# Patient Record
Sex: Female | Born: 1950
Health system: Southern US, Community
[De-identification: ages and names within clinical notes are randomized; demographics above are authoritative.]

## PROBLEM LIST (undated history)

## (undated) DIAGNOSIS — J449 Chronic obstructive pulmonary disease, unspecified: Secondary | ICD-10-CM

## (undated) DIAGNOSIS — E1142 Type 2 diabetes mellitus with diabetic polyneuropathy: Secondary | ICD-10-CM

## (undated) DIAGNOSIS — J45909 Unspecified asthma, uncomplicated: Secondary | ICD-10-CM

## (undated) DIAGNOSIS — E119 Type 2 diabetes mellitus without complications: Secondary | ICD-10-CM

## (undated) DIAGNOSIS — G4700 Insomnia, unspecified: Secondary | ICD-10-CM

## (undated) DIAGNOSIS — I447 Left bundle-branch block, unspecified: Secondary | ICD-10-CM

## (undated) DIAGNOSIS — I10 Essential (primary) hypertension: Secondary | ICD-10-CM

## (undated) HISTORY — DX: Essential (primary) hypertension: I10

## (undated) HISTORY — DX: Unspecified asthma, uncomplicated: J45.909

## (undated) HISTORY — DX: Type 2 diabetes mellitus without complications: E11.9

## (undated) HISTORY — DX: Left bundle-branch block, unspecified: I44.7

## (undated) HISTORY — DX: Chronic obstructive pulmonary disease, unspecified: J44.9

## (undated) HISTORY — DX: Insomnia, unspecified: G47.00

## (undated) HISTORY — PX: NO PAST SURGERIES: SHX2092

## (undated) HISTORY — DX: Type 2 diabetes mellitus with diabetic polyneuropathy: E11.42

---

## 2014-12-13 ENCOUNTER — Other Ambulatory Visit: Payer: Self-pay | Admitting: Family Medicine

## 2014-12-13 DIAGNOSIS — N644 Mastodynia: Secondary | ICD-10-CM

## 2014-12-13 DIAGNOSIS — Z1231 Encounter for screening mammogram for malignant neoplasm of breast: Secondary | ICD-10-CM

## 2014-12-20 ENCOUNTER — Ambulatory Visit
Admission: RE | Admit: 2014-12-20 | Discharge: 2014-12-20 | Disposition: A | Discharge status: Home / Self Care | Payer: No Typology Code available for payment source | Source: Ambulatory Visit | Attending: Family Medicine | Admitting: Family Medicine

## 2014-12-20 DIAGNOSIS — Z1231 Encounter for screening mammogram for malignant neoplasm of breast: Secondary | ICD-10-CM

## 2014-12-27 ENCOUNTER — Ambulatory Visit: Payer: Self-pay

## 2016-07-17 ENCOUNTER — Telehealth: Payer: Self-pay | Admitting: *Deleted

## 2016-07-17 NOTE — Telephone Encounter (Signed)
NOTES SENT TO SCHEDULING.  °

## 2016-07-30 ENCOUNTER — Other Ambulatory Visit: Payer: Self-pay

## 2016-07-30 DIAGNOSIS — E119 Type 2 diabetes mellitus without complications: Secondary | ICD-10-CM | POA: Insufficient documentation

## 2016-07-30 DIAGNOSIS — J45909 Unspecified asthma, uncomplicated: Secondary | ICD-10-CM | POA: Insufficient documentation

## 2016-07-30 DIAGNOSIS — I447 Left bundle-branch block, unspecified: Secondary | ICD-10-CM | POA: Insufficient documentation

## 2016-07-30 DIAGNOSIS — E1142 Type 2 diabetes mellitus with diabetic polyneuropathy: Secondary | ICD-10-CM | POA: Insufficient documentation

## 2016-07-30 DIAGNOSIS — I10 Essential (primary) hypertension: Secondary | ICD-10-CM | POA: Insufficient documentation

## 2016-07-30 DIAGNOSIS — J449 Chronic obstructive pulmonary disease, unspecified: Secondary | ICD-10-CM | POA: Insufficient documentation

## 2016-07-30 NOTE — Progress Notes (Signed)
Cardiology Office Note NEW PATIENT VISIT   Date:  07/31/2016   ID:  Krystal Mckay, DOB 1950/07/27, MRN [id number]  PCP:  Kathyrn Lass, MD Dr. Kathyrn Lass Cardiologist:  NEW  Dr. Burt Knack  Chief Complaint  Patient presents with  . Chest Pain      History of Present Illness: Krystal Mckay is a 66 y.o. female from [place] with her daughter she is helping translate at times, who presents for LBBB and Lt sided chest pain, SOB and fatigue with hx HTN and DM.   She was on statin for cholesterol but she stopped.  Other hx includes Asthma, COPD, diabetic peripheral neuropathy, bil lower ext edema.   Today she has episodes of chest pain about once a week on Lt side. Also with SOB, and severe fatigue as if she could faint though she never has.  Chest pain more with washing dishes or lying on her Lt side.   She also has occ palpitations.   Rare nausea with these episodes,  She does have LBBB unknown if this is new or old.  BP is elevated today but was 126/60 with PCP.    Her parents died in their 3s and she does have one brother that died suddenly unknown reason.  Her risk factors HTN, DM, HLD and possible family hx.    Past Medical History:  Diagnosis Date  . Asthma   . COPD (chronic obstructive pulmonary disease) ([facility])   . Diabetic peripheral neuropathy ([facility])   . Hypertension   . Insomnia   . LBBB (left bundle branch block)   . Type 2 diabetes mellitus ([facility])     Past Surgical History:  Procedure Laterality Date  . NO PAST SURGERIES       Current Outpatient Prescriptions  Medication Sig Dispense Refill  . albuterol (PROVENTIL HFA;VENTOLIN HFA) 108 (90 Base) MCG/ACT inhaler Inhale 2 puffs into the lungs every 4 (four) hours as needed for wheezing or shortness of breath.    Marland Kitchen amLODipine (NORVASC) 10 MG tablet Take 10 mg by mouth daily.    Marland Kitchen atorvastatin (LIPITOR) 10 MG tablet Take 10 mg by mouth daily.    . Fluticasone-Salmeterol (ADVAIR) 500-50 MCG/DOSE AEPB Inhale 1  puff into the lungs 2 (two) times daily.    . metFORMIN (GLUCOPHAGE) 500 MG tablet Take 1,000 mg by mouth 2 (two) times daily with a meal.    . Olopatadine HCl (PATADAY) 0.2 % SOLN Apply to eye.     No current facility-administered medications for this visit.     Allergies:   Patient has no known allergies.    Social History:  The patient  reports that she has never smoked. She has never used smokeless tobacco. She reports that she does not drink alcohol or use drugs.   Family History:  The patient's family history includes Blindness in her father; Sudden death in her brother.    ROS:  General:recent sinusitis now treated, no weight changes Skin:no rashes or ulcers HEENT:no blurred vision, no congestion CV:see HPI PUL:see HPI GI:no diarrhea constipation or melena, no indigestion GU:no hematuria, no dysuria, freq urination MS:no joint pain, no claudication, + neuropathy Neuro:no syncope, + lightheadedness at times, + neuropathy of feet from diabetes. Endo:+ diabetes, no thyroid disease  Wt Readings from Last 3 Encounters:  07/31/16 189 lb 12.8 oz (86.1 kg)     PHYSICAL EXAM: VS:  BP (!) 150/78   Pulse 72   Ht 5' 6" (1.676 m)   Wt 189 lb  12.8 oz (86.1 kg)   LMP  (LMP Unknown)   BMI 30.63 kg/m  , BMI Body mass index is 30.63 kg/m. General:Pleasant affect, NAD Skin:Warm and dry, brisk capillary refill HEENT:normocephalic, sclera clear, mucus membranes moist Neck:supple, no JVD, no bruits  Heart:S1S2 RRR without murmur, gallup, rub or click, + lt sided tenderness to palpation of Lt chest wall. Lungs:clear without rales, rhonchi, or wheezes XKG:YJEH, non tender, + BS, do not palpate liver spleen or masses Ext:no lower ext edema, 2+ pedal pulses, 2+ radial pulses Neuro:alert and oriented, MAE, follows commands, + facial symmetry    EKG:  EKG is ordered today. The ekg ordered today demonstrates SR with LBBB, unknown age.   Recent Labs: No results found for requested  labs within last 8760 hours.    Lipid Panel No results found for: CHOL, TRIG, HDL, CHOLHDL, VLDL, LDLCALC, LDLDIRECT     Other studies Reviewed: Additional studies/ records that were reviewed today include: previous office notes and EKG.   ASSESSMENT AND PLAN:  1.  Chest pain - with LBBB and hx HTN, DM and elevated LDL.  Will proceed with lexiscan myoview to evaluate - this seems muscular skeletal and she can take ibuprofen for the pain for now.  I also asked her to take 81 mg ASA until test are back.    2. SOB with lexiscan myoview and echo to eval LV function.    3. HTN elevated today but was controlled at PCP, will check with stress test.  4. HLD will recheck today off statin.  5.  DM-2 per PCP, she does have neuropathy as well.   Dr. Burt Knack met her and discussed tests.  I will follow up with results.      Current medicines are reviewed with the patient today.  The patient Has no concerns regarding medicines.  The following changes have been made:  See above Labs/ tests ordered today include:see above  Disposition:   FU:  see above  Signed, Cecilie Kicks, NP  07/31/2016 10:01 AM    [facility], [address] [place], [place], [place] Phone: [phone]; Fax: [phone]

## 2016-07-31 ENCOUNTER — Ambulatory Visit (INDEPENDENT_AMBULATORY_CARE_PROVIDER_SITE_OTHER): Payer: No Typology Code available for payment source | Admitting: Cardiology

## 2016-07-31 ENCOUNTER — Encounter: Payer: Self-pay | Admitting: Cardiology

## 2016-07-31 ENCOUNTER — Encounter (INDEPENDENT_AMBULATORY_CARE_PROVIDER_SITE_OTHER): Payer: Self-pay

## 2016-07-31 VITALS — BP 150/78 | HR 72 | Ht 66.0 in | Wt 189.8 lb

## 2016-07-31 DIAGNOSIS — E118 Type 2 diabetes mellitus with unspecified complications: Secondary | ICD-10-CM

## 2016-07-31 DIAGNOSIS — I10 Essential (primary) hypertension: Secondary | ICD-10-CM

## 2016-07-31 DIAGNOSIS — R0602 Shortness of breath: Secondary | ICD-10-CM

## 2016-07-31 DIAGNOSIS — E782 Mixed hyperlipidemia: Secondary | ICD-10-CM

## 2016-07-31 DIAGNOSIS — R079 Chest pain, unspecified: Secondary | ICD-10-CM

## 2016-07-31 LAB — LIPID PANEL
CHOLESTEROL TOTAL: 166 mg/dL (ref 100–199)
Chol/HDL Ratio: 2.8 ratio (ref 0.0–4.4)
HDL: 60 mg/dL (ref >39)
LDL Calculated: 96 mg/dL (ref 0–99)
Triglycerides: 48 mg/dL (ref 0–149)
VLDL CHOLESTEROL CAL: 10 mg/dL (ref 5–40)

## 2016-07-31 LAB — COMPREHENSIVE METABOLIC PANEL
A/G RATIO: 1.5 (ref 1.2–2.2)
ALBUMIN: 4.3 g/dL (ref 3.6–4.8)
ALT: 18 IU/L (ref 0–32)
AST: 15 IU/L (ref 0–40)
Alkaline Phosphatase: 66 IU/L (ref 39–117)
BUN / CREAT RATIO: 16 (ref 12–28)
BUN: 13 mg/dL (ref 8–27)
CALCIUM: 9.5 mg/dL (ref 8.7–10.3)
CHLORIDE: 103 mmol/L (ref 96–106)
CO2: 24 mmol/L (ref 18–29)
Creatinine, Ser: 0.81 mg/dL (ref 0.57–1.00)
GFR, EST AFRICAN AMERICAN: 89 mL/min/{1.73_m2} (ref >59)
GFR, EST NON AFRICAN AMERICAN: 77 mL/min/{1.73_m2} (ref >59)
GLOBULIN, TOTAL: 2.8 g/dL (ref 1.5–4.5)
Glucose: 94 mg/dL (ref 65–99)
POTASSIUM: 4.7 mmol/L (ref 3.5–5.2)
SODIUM: 143 mmol/L (ref 134–144)
TOTAL PROTEIN: 7.1 g/dL (ref 6.0–8.5)

## 2016-07-31 LAB — TSH: TSH: 2.04 u[IU]/mL (ref 0.450–4.500)

## 2016-07-31 LAB — T4, FREE: Free T4: 1.22 ng/dL (ref 0.82–1.77)

## 2016-07-31 NOTE — Addendum Note (Signed)
Addended by: Tonita PhoenixBOWDEN, ROBIN K on: 07/31/2016 10:18 AM   Modules accepted: Orders

## 2016-07-31 NOTE — Addendum Note (Signed)
Addended by: Brien MatesGONZALEZ, DANIELLE R on: 07/31/2016 10:18 AM   Modules accepted: Orders

## 2016-07-31 NOTE — Patient Instructions (Signed)
Medication Instructions:  Your physician has recommended you make the following change in your medication: 1.)START Aspirin 81 mg daily.   Labwork: Your physician recommends that you return for lab work today for LIPID, CMET, TSH, FREE T4  Testing/Procedures: Your physician has requested that you have an echocardiogram. Echocardiography is a painless test that uses sound waves to create images of your heart. It provides your doctor with information about the size and shape of your heart and how well your heart's chambers and valves are working. This procedure takes approximately one hour. There are no restrictions for this procedure.  Your physician has requested that you have a lexiscan myoview. For further information please visit [email or web address]. Please follow instruction sheet, as given.    Follow-Up: Your physician recommends that you schedule a follow-up appointment with Nada BoozerLaura Ingold, FNP after tests are completed.   Any Other Special Instructions Will Be Listed Below (If Applicable).     If you need a refill on your cardiac medications before your next appointment, please call your pharmacy.  Thank you for choosing [facility]  Corrine CMA AAMA

## 2016-08-12 ENCOUNTER — Telehealth (HOSPITAL_COMMUNITY): Payer: Self-pay | Admitting: *Deleted

## 2016-08-12 NOTE — Telephone Encounter (Signed)
Patient's daughter given detailed instructions per DPR per Myocardial Perfusion Study Information Sheet for the test on 08/14/16 at 1000. Patient notified to arrive 15 minutes early and that it is imperative to arrive on time for appointment to keep from having the test rescheduled.  If you need to cancel or reschedule your appointment, please call the office within 24 hours of your appointment. . Patient verbalized understanding.Caylor Cerino, Adelene IdlerCynthia W

## 2016-08-14 ENCOUNTER — Other Ambulatory Visit: Payer: Self-pay

## 2016-08-14 ENCOUNTER — Ambulatory Visit (HOSPITAL_COMMUNITY): Payer: No Typology Code available for payment source | Attending: Cardiology

## 2016-08-14 ENCOUNTER — Ambulatory Visit (HOSPITAL_BASED_OUTPATIENT_CLINIC_OR_DEPARTMENT_OTHER): Payer: No Typology Code available for payment source

## 2016-08-14 DIAGNOSIS — R079 Chest pain, unspecified: Secondary | ICD-10-CM

## 2016-08-14 DIAGNOSIS — I447 Left bundle-branch block, unspecified: Secondary | ICD-10-CM | POA: Insufficient documentation

## 2016-08-14 DIAGNOSIS — R0602 Shortness of breath: Secondary | ICD-10-CM

## 2016-08-14 LAB — MYOCARDIAL PERFUSION IMAGING
CHL CUP NUCLEAR SRS: 9
CHL CUP NUCLEAR SSS: 15
LV dias vol: 125 mL (ref 46–106)
LV sys vol: 66 mL
NUC STRESS TID: 1.06
Peak HR: 83 {beats}/min
RATE: 0.24
Rest HR: 64 {beats}/min
SDS: 6

## 2016-08-14 MED ORDER — REGADENOSON 0.4 MG/5ML IV SOLN
0.4000 mg | Freq: Once | INTRAVENOUS | Status: AC
  Administered 2016-08-14: 0.4 mg via INTRAVENOUS

## 2016-08-14 MED ORDER — TECHNETIUM TC 99M TETROFOSMIN IV KIT
10.3000 | PACK | Freq: Once | INTRAVENOUS | Status: AC | PRN
  Administered 2016-08-14: 10.3 via INTRAVENOUS
  Filled 2016-08-14: qty 11

## 2016-08-14 MED ORDER — TECHNETIUM TC 99M TETROFOSMIN IV KIT
30.6000 | PACK | Freq: Once | INTRAVENOUS | Status: AC | PRN
  Administered 2016-08-14: 30.6 via INTRAVENOUS
  Filled 2016-08-14: qty 31

## 2016-08-18 NOTE — Progress Notes (Signed)
Cardiology Office Note   Date:  08/19/2016   ID:  Bethanee, Redondo October 22, 1950, MRN [id number]  PCP:  Sigmund Hazel, MD  Cardiologist:  Dr. Excell Seltzer    Chief Complaint  Patient presents with  . Chest Pain      History of Present Illness: Krystal Mckay is a 66 y.o. female who presents for follow up after stress test.  Also hx of Asthma, COPD, diabetic peripheral neuropathy, bil lower ext edema.  She does have LBBB unknown if this is new or old  Labs are normal. Echo: Left ventricle: The cavity size was normal. There was moderate   concentric hypertrophy. Systolic function was normal. The   estimated ejection fraction was in the range of 55% to 60%. Wall   motion was normal; there were no regional wall motion   abnormalities. Features are consistent with a pseudonormal left   ventricular filling pattern, with concomitant abnormal relaxation   and increased filling pressure (grade 2 diastolic dysfunction).   Doppler parameters are consistent with high ventricular filling   pressure. - Ventricular septum: Septal motion showed paradox. These changes   are consistent with a left bundle branch block. - Aortic valve: Trileaflet; mildly thickened, mildly calcified   leaflets. - Mitral valve: Calcified annulus. There was mild regurgitation.  Nuc study:  Nuclear stress EF: 47%.  There was no ST segment deviation noted during stress.  The left ventricular ejection fraction is mildly decreased (45-54%).  This is an intermediate risk study.   1. EF 47% with septal hypokinesis.  2. Partially reversible, medium-sized, intermediate intensity basal to mid anterior/anteroseptal/inferoseptal perfusion defect.  This could be artifact due to LBBB.  However, cannot fully rule out infarction with peri-infarct ischemia given wall motion abnormality but unusual wall segment involvement for coronary disease.    Overall, intermediate risk study  Today she denies chest pain or SOB  today.  She is feeling well.  I discussed her studies with Dr. Excell Seltzer and most likely nuc abnormality is related to LBBB.  But will proceed with Cardiac CTA with FFR to eval for disease and flow.  I discussed with pt and her daughter by phone.  Pt is to go to [place] in August.  Both agreeable to proceed.  Lipids stable and will add statin if CT +.      Past Medical History:  Diagnosis Date  . Asthma   . COPD (chronic obstructive pulmonary disease) ([facility])   . Diabetic peripheral neuropathy ([facility])   . Hypertension   . Insomnia   . LBBB (left bundle branch block)   . Type 2 diabetes mellitus ([facility])     Past Surgical History:  Procedure Laterality Date  . NO PAST SURGERIES       Current Outpatient Prescriptions  Medication Sig Dispense Refill  . albuterol (PROVENTIL HFA;VENTOLIN HFA) 108 (90 Base) MCG/ACT inhaler Inhale 2 puffs into the lungs every 4 (four) hours as needed for wheezing or shortness of breath.    Marland Kitchen amLODipine (NORVASC) 10 MG tablet Take 10 mg by mouth daily.    Marland Kitchen aspirin EC 81 MG tablet Take 81 mg by mouth daily.    . Fluticasone-Salmeterol (ADVAIR) 500-50 MCG/DOSE AEPB Inhale 1 puff into the lungs 2 (two) times daily.    . metFORMIN (GLUCOPHAGE) 500 MG tablet Take 1,000 mg by mouth 2 (two) times daily with a meal.    . Olopatadine HCl (PATADAY) 0.2 % SOLN Apply to eye.     No current facility-administered medications  for this visit.     Allergies:   Patient has no known allergies.    Social History:  The patient  reports that she has never smoked. She has never used smokeless tobacco. She reports that she does not drink alcohol or use drugs.   Family History:  The patient's family history includes Blindness in her father; Sudden death in her brother.    ROS:  General:no colds or fevers, no weight changes Skin:no rashes or ulcers HEENT:no blurred vision, no congestion CV:see HPI PUL:see HPI GI:no diarrhea constipation or melena, no indigestion GU:no hematuria,  no dysuria MS:no joint pain, no claudication Neuro:no syncope, no lightheadedness Endo:no diabetes, no thyroid disease  Wt Readings from Last 3 Encounters:  08/19/16 191 lb 6.4 oz (86.8 kg)  08/14/16 189 lb (85.7 kg)  07/31/16 189 lb 12.8 oz (86.1 kg)     PHYSICAL EXAM: VS:  BP 124/60   Pulse 70   Ht 5\' 6"  (1.676 m)   Wt 191 lb 6.4 oz (86.8 kg)   LMP  (LMP Unknown)   SpO2 98%   BMI 30.89 kg/m  , BMI Body mass index is 30.89 kg/m. General:Pleasant affect, NAD Skin:Warm and dry, brisk capillary refill HEENT:normocephalic, sclera clear, mucus membranes moist Neck:supple, no JVD, no bruits  Heart:S1S2 RRR without murmur, gallup, rub or click Lungs:clear without rales, rhonchi, or wheezes JXB:JYNWAbd:soft, non tender, + BS, do not palpate liver spleen or masses Ext:no lower ext edema, 2+ pedal pulses, 2+ radial pulses Neuro:alert and oriented, MAE, follows commands, + facial symmetry    EKG:  EKG is NOT ordered today.    Recent Labs: 07/31/2016: ALT 18; BUN 13; Creatinine, Ser 0.81; Potassium 4.7; Sodium 143; TSH 2.040    Lipid Panel    Component Value Date/Time   CHOL 166 07/31/2016 1018   TRIG 48 07/31/2016 1018   HDL 60 07/31/2016 1018   CHOLHDL 2.8 07/31/2016 1018   LDLCALC 96 07/31/2016 1018       Other studies Reviewed: Additional studies/ records that were reviewed today include: see above.   ASSESSMENT AND PLAN:  1.  Chest pain with LBBB hx HTN, DM elevated LDL at times.  Now on ASA.  intermediate nuc.  Will plan for Cardiac CTA and FFR will follow up if + but if neg. Follow up prn.  2. SOB has resolved  3. HTN controlled  4. DM-2 followed by PCP.    Current medicines are reviewed with the patient today.  The patient Has no concerns regarding medicines.  The following changes have been made:  See above Labs/ tests ordered today include:see above  Disposition:   FU:  see above  Signed, Nada BoozerLaura Dejha King, NP  08/19/2016 9:42 PM    [facility] [address], [place], [place]  [address]/ [address] [place], [place] Phone: [phone]; Fax: [phone]  [phone]

## 2016-08-19 ENCOUNTER — Encounter (INDEPENDENT_AMBULATORY_CARE_PROVIDER_SITE_OTHER): Payer: Self-pay

## 2016-08-19 ENCOUNTER — Encounter: Payer: Self-pay | Admitting: Cardiology

## 2016-08-19 ENCOUNTER — Ambulatory Visit (INDEPENDENT_AMBULATORY_CARE_PROVIDER_SITE_OTHER): Payer: No Typology Code available for payment source | Admitting: Cardiology

## 2016-08-19 VITALS — BP 124/60 | HR 70 | Ht 66.0 in | Wt 191.4 lb

## 2016-08-19 DIAGNOSIS — R0602 Shortness of breath: Secondary | ICD-10-CM

## 2016-08-19 DIAGNOSIS — E118 Type 2 diabetes mellitus with unspecified complications: Secondary | ICD-10-CM

## 2016-08-19 DIAGNOSIS — R9439 Abnormal result of other cardiovascular function study: Secondary | ICD-10-CM

## 2016-08-19 DIAGNOSIS — R079 Chest pain, unspecified: Secondary | ICD-10-CM

## 2016-08-19 DIAGNOSIS — I10 Essential (primary) hypertension: Secondary | ICD-10-CM

## 2016-08-19 NOTE — Patient Instructions (Signed)
Medication Instructions:  None  Labwork: None  Testing/Procedures: Your physician has requested that you have cardiac CT. Cardiac computed tomography (CT) is a painless test that uses an x-ray machine to take clear, detailed pictures of your heart. For further information please visit [email or web address]. Please follow instruction sheet as given.   Follow-Up: Your physician recommends that you schedule a follow-up appointment will be based on outcome of CT.   Any Other Special Instructions Will Be Listed Below (If Applicable).     If you need a refill on your cardiac medications before your next appointment, please call your pharmacy.

## 2016-09-24 ENCOUNTER — Encounter: Payer: Self-pay | Admitting: Cardiology

## 2016-10-08 ENCOUNTER — Ambulatory Visit (HOSPITAL_COMMUNITY)
Admission: RE | Admit: 2016-10-08 | Discharge: 2016-10-08 | Disposition: A | Discharge status: Home / Self Care | Payer: Self-pay | Source: Ambulatory Visit | Attending: Cardiology | Admitting: Cardiology

## 2016-10-08 ENCOUNTER — Ambulatory Visit (HOSPITAL_COMMUNITY)
Admission: RE | Admit: 2016-10-08 | Discharge: 2016-10-08 | Disposition: A | Discharge status: Home / Self Care | Payer: No Typology Code available for payment source | Source: Ambulatory Visit | Attending: Cardiology | Admitting: Cardiology

## 2016-10-08 DIAGNOSIS — R9439 Abnormal result of other cardiovascular function study: Secondary | ICD-10-CM

## 2016-10-08 LAB — POCT I-STAT CREATININE: CREATININE: 0.8 mg/dL (ref 0.44–1.00)

## 2016-10-08 MED ORDER — NITROGLYCERIN 0.4 MG SL SUBL
0.8000 mg | SUBLINGUAL_TABLET | SUBLINGUAL | Status: DC
  Administered 2016-10-08: 0.8 mg via SUBLINGUAL
  Filled 2016-10-08: qty 25

## 2016-10-08 MED ORDER — METOPROLOL TARTRATE 5 MG/5ML IV SOLN
INTRAVENOUS | Status: AC
  Filled 2016-10-08: qty 5

## 2016-10-08 MED ORDER — METOPROLOL TARTRATE 5 MG/5ML IV SOLN
5.0000 mg | INTRAVENOUS | Status: DC
  Administered 2016-10-08: 5 mg via INTRAVENOUS
  Filled 2016-10-08: qty 5

## 2016-10-08 MED ORDER — IOPAMIDOL (ISOVUE-370) INJECTION 76%
INTRAVENOUS | Status: AC
  Administered 2016-10-08: 100 mL
  Filled 2016-10-08: qty 100

## 2016-10-08 MED ORDER — NITROGLYCERIN 0.4 MG SL SUBL
SUBLINGUAL_TABLET | SUBLINGUAL | Status: AC
  Filled 2016-10-08: qty 2

## 2017-08-25 ENCOUNTER — Other Ambulatory Visit: Payer: Self-pay

## 2017-08-25 ENCOUNTER — Emergency Department (HOSPITAL_BASED_OUTPATIENT_CLINIC_OR_DEPARTMENT_OTHER): Payer: Medicare Other

## 2017-08-25 ENCOUNTER — Emergency Department (HOSPITAL_BASED_OUTPATIENT_CLINIC_OR_DEPARTMENT_OTHER)
Admission: EM | Admit: 2017-08-25 | Discharge: 2017-08-25 | Disposition: A | Discharge status: Home / Self Care | Payer: Medicare Other | Attending: Physician Assistant | Admitting: Physician Assistant

## 2017-08-25 ENCOUNTER — Encounter (HOSPITAL_BASED_OUTPATIENT_CLINIC_OR_DEPARTMENT_OTHER): Payer: Self-pay | Admitting: *Deleted

## 2017-08-25 DIAGNOSIS — E1140 Type 2 diabetes mellitus with diabetic neuropathy, unspecified: Secondary | ICD-10-CM | POA: Diagnosis not present

## 2017-08-25 DIAGNOSIS — I10 Essential (primary) hypertension: Secondary | ICD-10-CM | POA: Insufficient documentation

## 2017-08-25 DIAGNOSIS — B349 Viral infection, unspecified: Secondary | ICD-10-CM | POA: Insufficient documentation

## 2017-08-25 DIAGNOSIS — R05 Cough: Secondary | ICD-10-CM | POA: Diagnosis present

## 2017-08-25 DIAGNOSIS — Z7984 Long term (current) use of oral hypoglycemic drugs: Secondary | ICD-10-CM | POA: Insufficient documentation

## 2017-08-25 DIAGNOSIS — Z7982 Long term (current) use of aspirin: Secondary | ICD-10-CM | POA: Insufficient documentation

## 2017-08-25 DIAGNOSIS — J449 Chronic obstructive pulmonary disease, unspecified: Secondary | ICD-10-CM | POA: Insufficient documentation

## 2017-08-25 DIAGNOSIS — Z79899 Other long term (current) drug therapy: Secondary | ICD-10-CM | POA: Diagnosis not present

## 2017-08-25 LAB — HEPATIC FUNCTION PANEL
ALK PHOS: 77 U/L (ref 38–126)
ALT: 14 U/L (ref 14–54)
AST: 19 U/L (ref 15–41)
Albumin: 3.7 g/dL (ref 3.5–5.0)
Bilirubin, Direct: <0.1 mg/dL — ABNORMAL LOW (ref 0.1–0.5)
TOTAL PROTEIN: 7.5 g/dL (ref 6.5–8.1)
Total Bilirubin: 0.4 mg/dL (ref 0.3–1.2)

## 2017-08-25 LAB — BASIC METABOLIC PANEL
ANION GAP: 8 (ref 5–15)
BUN: 12 mg/dL (ref 6–20)
CO2: 27 mmol/L (ref 22–32)
Calcium: 9 mg/dL (ref 8.9–10.3)
Chloride: 104 mmol/L (ref 101–111)
Creatinine, Ser: 0.72 mg/dL (ref 0.44–1.00)
GFR calc Af Amer: >60 mL/min (ref >60)
GFR calc non Af Amer: >60 mL/min (ref >60)
GLUCOSE: 141 mg/dL — AB (ref 65–99)
POTASSIUM: 3.4 mmol/L — AB (ref 3.5–5.1)
Sodium: 139 mmol/L (ref 135–145)

## 2017-08-25 LAB — CBC
HCT: 39.5 % (ref 36.0–46.0)
HEMOGLOBIN: 13.3 g/dL (ref 12.0–15.0)
MCH: 27.3 pg (ref 26.0–34.0)
MCHC: 33.7 g/dL (ref 30.0–36.0)
MCV: 81.1 fL (ref 78.0–100.0)
Platelets: 246 10*3/uL (ref 150–400)
RBC: 4.87 MIL/uL (ref 3.87–5.11)
RDW: 14.5 % (ref 11.5–15.5)
WBC: 4.1 10*3/uL (ref 4.0–10.5)

## 2017-08-25 LAB — TROPONIN I: Troponin I: <0.03 ng/mL (ref <0.03)

## 2017-08-25 LAB — URINALYSIS, ROUTINE W REFLEX MICROSCOPIC
Bilirubin Urine: NEGATIVE
GLUCOSE, UA: NEGATIVE mg/dL
Hgb urine dipstick: NEGATIVE
KETONES UR: NEGATIVE mg/dL
LEUKOCYTES UA: NEGATIVE
Nitrite: NEGATIVE
PH: 6.5 (ref 5.0–8.0)
Protein, ur: NEGATIVE mg/dL
Specific Gravity, Urine: <1.005 — ABNORMAL LOW (ref 1.005–1.030)

## 2017-08-25 LAB — LIPASE, BLOOD: Lipase: 36 U/L (ref 11–51)

## 2017-08-25 MED ORDER — SODIUM CHLORIDE 0.9 % IV BOLUS
1000.0000 mL | Freq: Once | INTRAVENOUS | Status: AC
Start: 2017-08-25 — End: 2017-08-25
  Administered 2017-08-25: 1000 mL via INTRAVENOUS

## 2017-08-25 MED ORDER — ACETAMINOPHEN 500 MG PO TABS: 1000.0000 mg | ORAL_TABLET | Freq: Three times a day (TID) | ORAL | 0 refills | Status: AC | PRN

## 2017-08-25 MED ORDER — ACETAMINOPHEN 500 MG PO TABS
1000.0000 mg | ORAL_TABLET | Freq: Three times a day (TID) | ORAL | Status: DC | PRN
  Administered 2017-08-25: 1000 mg via ORAL
  Filled 2017-08-25: qty 2

## 2017-08-25 MED ORDER — POTASSIUM CHLORIDE 10 MEQ/100ML IV SOLN
10.0000 meq | Freq: Once | INTRAVENOUS | Status: AC
  Administered 2017-08-25: 10 meq via INTRAVENOUS
  Filled 2017-08-25: qty 100

## 2017-08-25 NOTE — ED Provider Notes (Signed)
[facility] EMERGENCY DEPARTMENT Provider Note   CSN: [id number] Arrival date & time: 08/25/17  1234     History   Chief Complaint Chief Complaint  Patient presents with  . Chest Pain    HPI Krystal Mckay is a 67 y.o. female.  HPI   Patient is a 67 year old female from [place] presenting today with "sickness all over".  Patient is very sweet, however is not a very good historian.  Patient reports that she "has the sickness in her ".  She reports that she has feelings of sickness all over.  Occasional cough, congestion, occasional shortness of breath.  She says however she went to physician for this.  Looking at her records she went to a cardiologist on the 11th where she had an stress test showing EF of 47%.  She denies any exertional chest pain currently.  She does have cough and congestion and reports that she feels fatigued.  She denies any fever, vomiting, diarrhea..  She reports that the symptoms currently been going on for about 3 days.  She reports that the most concerning symptoms are that she feels restless, and is having trouble sleeping.  Past Medical History:  Diagnosis Date  . Asthma   . COPD (chronic obstructive pulmonary disease) ([facility])   . Diabetic peripheral neuropathy ([facility])   . Hypertension   . Insomnia   . LBBB (left bundle branch block)   . Type 2 diabetes mellitus [facility])     Patient Active Problem List   Diagnosis Date Noted  . Type 2 diabetes mellitus ([facility])   . Diabetic peripheral neuropathy ([facility])   . Hypertension   . Asthma   . COPD (chronic obstructive pulmonary disease) ([facility])   . LBBB (left bundle branch block)     Past Surgical History:  Procedure Laterality Date  . NO PAST SURGERIES       OB History   None      Home Medications    Prior to Admission medications   Medication Sig Start Date End Date Taking? Authorizing Provider  acetaminophen (TYLENOL) 500 MG tablet Take 2 tablets (1,000 mg total) by mouth every 8  (eight) hours as needed for moderate pain. 08/25/17   Morrie Daywalt Lyn, MD  albuterol (PROVENTIL HFA;VENTOLIN HFA) 108 (90 Base) MCG/ACT inhaler Inhale 2 puffs into the lungs every 4 (four) hours as needed for wheezing or shortness of breath.    [provider]  amLODipine (NORVASC) 10 MG tablet Take 10 mg by mouth daily.    [provider]  aspirin EC 81 MG tablet Take 81 mg by mouth daily.    [provider]  Fluticasone-Salmeterol (ADVAIR) 500-50 MCG/DOSE AEPB Inhale 1 puff into the lungs 2 (two) times daily.    [provider]  metFORMIN (GLUCOPHAGE) 500 MG tablet Take 1,000 mg by mouth 2 (two) times daily with a meal.    [provider]  Olopatadine HCl (PATADAY) 0.2 % SOLN Apply to eye.    [provider]    Family History Family History  Problem Relation Age of Onset  . Blindness Father   . Sudden death Brother     Social History Social History   Tobacco Use  . Smoking status: Never Smoker  . Smokeless tobacco: Never Used  Substance Use Topics  . Alcohol use: No  . Drug use: No     Allergies   Patient has no known allergies.   Review of Systems Review of Systems  Constitutional:  Positive for activity change and fatigue.  Respiratory: Positive for cough. Negative for shortness of breath.   Cardiovascular: Negative for chest pain.  Gastrointestinal: Negative for abdominal pain, nausea and vomiting.  Musculoskeletal: Positive for arthralgias.  Neurological: Positive for weakness and headaches.  Psychiatric/Behavioral: Positive for sleep disturbance.  All other systems reviewed and are negative.    Physical Exam Updated Vital Signs BP 128/65   Pulse (!) 58   Resp 15   Ht 5\' 4"  (1.626 m)   Wt 86.6 kg (191 lb)   LMP  (LMP Unknown)   SpO2 99%   BMI 32.79 kg/m   Physical Exam  Constitutional: She is oriented to person, place, and time. She appears well-developed and well-nourished.  HENT:  Head:  Normocephalic and atraumatic.  Eyes: Right eye exhibits no discharge.  Cardiovascular: Normal rate, intact distal pulses and normal pulses.  Pulmonary/Chest: Effort normal. No accessory muscle usage. No respiratory distress.  Abdominal: Soft. Bowel sounds are normal. There is no tenderness.  Neurological: She is oriented to person, place, and time.  Skin: Skin is warm and dry. She is not diaphoretic.  Psychiatric: She has a normal mood and affect.  Nursing note and vitals reviewed.    ED Treatments / Results  Labs (all labs ordered are listed, but only abnormal results are displayed) Labs Reviewed  BASIC METABOLIC PANEL - Abnormal; Notable for the following components:      Result Value   Potassium 3.4 (*)    Glucose, Bld 141 (*)    All other components within normal limits  HEPATIC FUNCTION PANEL - Abnormal; Notable for the following components:   Bilirubin, Direct <0.1 (*)    All other components within normal limits  URINALYSIS, ROUTINE W REFLEX MICROSCOPIC - Abnormal; Notable for the following components:   Specific Gravity, Urine <1.005 (*)    All other components within normal limits  CBC  TROPONIN I  LIPASE, BLOOD    EKG EKG Interpretation  Date/Time:  Monday August 25 2017 12:42:40 EDT Ventricular Rate:  74 PR Interval:  146 QRS Duration: 152 QT Interval:  430 QTC Calculation: 477 R Axis:   61 Text Interpretation:  Normal sinus rhythm Left bundle branch block Abnormal ECG No old tracing to compare Confirmed by Rolan Bucco [phone]) on 08/25/2017 12:52:23 PM   Radiology Dg Chest 2 View  Result Date: 08/25/2017 CLINICAL DATA:  One week of chest pain, syncopal episodes, shortness of breath, and inability is sleepy. History of asthma-COPD, diabetes, never smoked. EXAM: CHEST - 2 VIEW COMPARISON:  Chest x-ray of March 04, 2016. FINDINGS: The lungs are adequately inflated. There is no focal infiltrate. There is no pleural effusion. The heart and pulmonary  vascularity are normal. The mediastinum is normal in width. The trachea is midline. The bony thorax exhibits no acute abnormality. IMPRESSION: There is no active cardiopulmonary disease. Electronically Signed   By: David  [place] M.D.   On: 08/25/2017 13:03    Procedures Procedures (including critical care time)  Medications Ordered in ED Medications  sodium chloride 0.9 % bolus 1,000 mL (0 mLs Intravenous Stopped 08/25/17 1818)  potassium chloride 10 mEq in 100 mL IVPB (0 mEq Intravenous Stopped 08/25/17 1814)     Initial Impression / Assessment and Plan / ED Course  I have reviewed the triage vital signs and the nursing notes.  Pertinent labs & imaging results that were available during my care of the patient were reviewed by me and considered in my medical decision making (  see chart for details).     Patient is a 67 year old female from [place] presenting today with "sickness all over".  Patient is very sweet, however is not a very good historian.  Patient reports that she "has the sickness in her ".  She reports that she has feelings of sickness all over.  Occasional cough, congestion, occasional shortness of breath.  She says however she went to physician for this.  Looking at her records she went to a cardiologist on the 11th where she had an stress test showing EF of 47%.  She denies any exertional chest pain currently.  She does have cough and congestion and reports that she feels fatigued.  She denies any fever, vomiting, diarrhea..  She reports that the symptoms currently been going on for about 3 days.  She reports that the most concerning symptoms are that she feels restless, and is having trouble sleeping.   4:45 PM We will do baseline labs, troponin, EKG, chest x-ray, UA.  Respiratory cancers infection.  Ultimately think she had likely has a virus that is making her feel ill all over.  Will give fluids, touch of potassium given the low potassium.  Do not suspect cardiac disease  given the patient does not have chest pain, nor any exertional component to her feelings of feeling "ill".    Patient's labs, imaging, EKG all reassuring.  We will encourage patient to follow-up with primary care physician.  Think is likely viral syndrome causing patient's feeling of being ill. Final Clinical Impressions(s) / ED Diagnoses   Final diagnoses:  Viral syndrome    ED Discharge Orders        Ordered    acetaminophen (TYLENOL) 500 MG tablet  Every 8 hours PRN     08/25/17 1822       Abelino Derrick, MD 08/25/17 2322

## 2017-08-25 NOTE — ED Notes (Signed)
2 unsuccessful IV attempts.

## 2017-08-25 NOTE — Discharge Instructions (Signed)
We could not find any cause for your discomfort today.  We are glad to report that your labs all look very good.  Please follow-up with your primary care physician.  Especially if you have any chest pain, return immediately to the cardiologist as we discussed.

## 2017-08-25 NOTE — ED Triage Notes (Signed)
Chest pain for a week. Her MD gave her extensive testing and could not find any abnormalities. States she continues to have severe pain, fainting, sob and unable to sleep. She is in no distress at triage.

## 2018-02-16 ENCOUNTER — Emergency Department: Payer: Medicare Other

## 2018-02-16 ENCOUNTER — Emergency Department
Admission: EM | Admit: 2018-02-16 | Discharge: 2018-02-16 | Disposition: A | Discharge status: Home / Self Care | Payer: Medicare Other | Attending: Emergency Medicine | Admitting: Emergency Medicine

## 2018-02-16 ENCOUNTER — Encounter: Payer: Self-pay | Admitting: Emergency Medicine

## 2018-02-16 ENCOUNTER — Other Ambulatory Visit: Payer: Self-pay

## 2018-02-16 DIAGNOSIS — R079 Chest pain, unspecified: Secondary | ICD-10-CM | POA: Diagnosis not present

## 2018-02-16 DIAGNOSIS — E1142 Type 2 diabetes mellitus with diabetic polyneuropathy: Secondary | ICD-10-CM | POA: Insufficient documentation

## 2018-02-16 DIAGNOSIS — Z79899 Other long term (current) drug therapy: Secondary | ICD-10-CM | POA: Diagnosis not present

## 2018-02-16 DIAGNOSIS — J449 Chronic obstructive pulmonary disease, unspecified: Secondary | ICD-10-CM | POA: Insufficient documentation

## 2018-02-16 DIAGNOSIS — I10 Essential (primary) hypertension: Secondary | ICD-10-CM | POA: Insufficient documentation

## 2018-02-16 LAB — CBC
HCT: 41.3 % (ref 36.0–46.0)
HEMOGLOBIN: 13 g/dL (ref 12.0–15.0)
MCH: 26.7 pg (ref 26.0–34.0)
MCHC: 31.5 g/dL (ref 30.0–36.0)
MCV: 85 fL (ref 80.0–100.0)
Platelets: 207 10*3/uL (ref 150–400)
RBC: 4.86 MIL/uL (ref 3.87–5.11)
RDW: 14.5 % (ref 11.5–15.5)
WBC: 3.8 10*3/uL — ABNORMAL LOW (ref 4.0–10.5)
nRBC: 0 % (ref 0.0–0.2)

## 2018-02-16 LAB — TROPONIN I
Troponin I: <0.03 ng/mL (ref <0.03)
Troponin I: <0.03 ng/mL (ref <0.03)

## 2018-02-16 LAB — BASIC METABOLIC PANEL
ANION GAP: 9 (ref 5–15)
BUN: 14 mg/dL (ref 8–23)
CO2: 25 mmol/L (ref 22–32)
Calcium: 9 mg/dL (ref 8.9–10.3)
Chloride: 103 mmol/L (ref 98–111)
Creatinine, Ser: 0.87 mg/dL (ref 0.44–1.00)
GFR calc Af Amer: >60 mL/min (ref >60)
GFR calc non Af Amer: >60 mL/min (ref >60)
GLUCOSE: 200 mg/dL — AB (ref 70–99)
POTASSIUM: 3.6 mmol/L (ref 3.5–5.1)
Sodium: 137 mmol/L (ref 135–145)

## 2018-02-16 MED ORDER — SODIUM CHLORIDE 0.9 % IV BOLUS
1000.0000 mL | Freq: Once | INTRAVENOUS | Status: AC
  Administered 2018-02-16: 1000 mL via INTRAVENOUS

## 2018-02-16 MED ORDER — ASPIRIN 81 MG PO CHEW: 324.0000 mg | CHEWABLE_TABLET | Freq: Once | ORAL | Status: DC

## 2018-02-16 MED ORDER — NITROGLYCERIN 0.4 MG SL SUBL: 0.4000 mg | SUBLINGUAL_TABLET | SUBLINGUAL | Status: DC | PRN

## 2018-02-16 NOTE — Discharge Instructions (Addendum)

## 2018-02-16 NOTE — ED Triage Notes (Signed)
Chest pain started last night.  Says central chest and it makes her feel tired and sometimes she coughs.

## 2018-02-16 NOTE — ED Provider Notes (Signed)
[facility] Emergency Department Provider Note  ____________________________________________  Time seen: Approximately 1:35 PM  I have reviewed the triage vital signs and the nursing notes.   HISTORY  Chief Complaint Chest Pain   HPI Krystal Mckay is a 67 y.o. female with a history of asthma, COPD, diabetes, hypertension who presents for evaluation of chest pain.  Patient reports having had similar episodes of chest pain several times in the past.  She usually has it a few times a year.  This 1 started yesterday at 9 PM while she was playing.  She describes as a heaviness, located in the center of her chest and towards the left side.  She reports that the pain radiates to her back when she lays down.  She has had mild shortness of breath with exertion that is associated with the pain but no shortness of breath at rest.  No nausea, vomiting, diaphoresis.  She reports that the pain has been constant since 9 PM.  She reports having difficulty sleeping last night due to the pain.  She reports that the pain intensity of 5 out of 10 at its worse and is currently 2 out of 10.  She reports that the pain is similar to prior episodes.  She denies any personal or family history of heart attacks, blood clots, recent travel immobilization, leg pain or swelling, hemoptysis, or exogenous hormones.  No history of cancer.  Past Medical History:  Diagnosis Date  . Asthma   . COPD (chronic obstructive pulmonary disease) (HCC)   . Diabetic peripheral neuropathy (HCC)   . Hypertension   . Insomnia   . LBBB (left bundle branch block)   . Type 2 diabetes mellitus [facility])     Patient Active Problem List   Diagnosis Date Noted  . Type 2 diabetes mellitus (HCC)   . Diabetic peripheral neuropathy (HCC)   . Hypertension   . Asthma   . COPD (chronic obstructive pulmonary disease) (HCC)   . LBBB (left bundle branch block)     Past Surgical History:  Procedure Laterality Date  .  NO PAST SURGERIES      Prior to Admission medications   Medication Sig Start Date End Date Taking? Authorizing Provider  acetaminophen (TYLENOL) 500 MG tablet Take 2 tablets (1,000 mg total) by mouth every 8 (eight) hours as needed for moderate pain. 08/25/17   Mackuen, Courteney Lyn, MD  albuterol (PROVENTIL HFA;VENTOLIN HFA) 108 (90 Base) MCG/ACT inhaler Inhale 2 puffs into the lungs every 4 (four) hours as needed for wheezing or shortness of breath.    [provider]  amLODipine (NORVASC) 10 MG tablet Take 10 mg by mouth daily.    [provider]  aspirin EC 81 MG tablet Take 81 mg by mouth daily.    [provider]  Fluticasone-Salmeterol (ADVAIR) 500-50 MCG/DOSE AEPB Inhale 1 puff into the lungs 2 (two) times daily.    [provider]  metFORMIN (GLUCOPHAGE) 500 MG tablet Take 1,000 mg by mouth 2 (two) times daily with a meal.    [provider]  Olopatadine HCl (PATADAY) 0.2 % SOLN Apply to eye.    [provider]    Allergies Patient has no known allergies.  Family History  Problem Relation Age of Onset  . Blindness Father   . Sudden death Brother     Social History Social History   Tobacco Use  . Smoking status: Never Smoker  . Smokeless tobacco: Never Used  Substance  Use Topics  . Alcohol use: No  . Drug use: No    Review of Systems  Constitutional: Negative for fever. Eyes: Negative for visual changes. ENT: Negative for sore throat. Neck: No neck pain  Cardiovascular: + chest pain. Respiratory: Negative for shortness of breath. + DOE Gastrointestinal: Negative for abdominal pain, vomiting or diarrhea. Genitourinary: Negative for dysuria. Musculoskeletal: Negative for back pain. Skin: Negative for rash. Neurological: Negative for headaches, weakness or numbness. Psych: No SI or HI  ____________________________________________   PHYSICAL EXAM:  VITAL SIGNS: Vitals:   02/16/18 1730 02/16/18 1800  BP:  140/71 (!) 149/64  Pulse: 70 65  Resp: 17 16  SpO2: 97% 97%    Constitutional: Alert and oriented. Well appearing and in no apparent distress. HEENT:      Head: Normocephalic and atraumatic.         Eyes: Conjunctivae are normal. Sclera is non-icteric.       Mouth/Throat: Mucous membranes are moist.       Neck: Supple with no signs of meningismus. Cardiovascular: Regular rate and rhythm. IV/VI systolic murmur loudest at the RUSB. No gallops, or rubs. 2+ symmetrical distal pulses are present in all extremities. No JVD. Respiratory: Normal respiratory effort. Lungs are clear to auscultation bilaterally. No wheezes, crackles, or rhonchi.  Gastrointestinal: Soft, non tender, and non distended with positive bowel sounds. No rebound or guarding. Musculoskeletal: Nontender with normal range of motion in all extremities. No edema, cyanosis, or erythema of extremities. Neurologic: Normal speech and language. Face is symmetric. Moving all extremities. No gross focal neurologic deficits are appreciated. Skin: Skin is warm, dry and intact. No rash noted. Psychiatric: Mood and affect are normal. Speech and behavior are normal.  ____________________________________________   LABS (all labs ordered are listed, but only abnormal results are displayed)  Labs Reviewed  BASIC METABOLIC PANEL - Abnormal; Notable for the following components:      Result Value   Glucose, Bld 200 (*)    All other components within normal limits  CBC - Abnormal; Notable for the following components:   WBC 3.8 (*)    All other components within normal limits  TROPONIN I  TROPONIN I   ____________________________________________  EKG  ED ECG REPORT I, Nita Sickle, the attending physician, personally viewed and interpreted this ECG.  Normal sinus rhythm, rate of 80, left bundle branch block, T wave inversions in lateral leads.  T wave inversions in 1 and aVL are new when compared to  prior. ____________________________________________  RADIOLOGY  I have personally reviewed the images performed during this visit and I agree with the Radiologist's read.   Interpretation by Radiologist:  Dg Chest 2 View  Result Date: 02/16/2018 CLINICAL DATA:  Chest pain that started last night EXAM: CHEST - 2 VIEW COMPARISON:  08/25/2017 FINDINGS: Normal heart size and mediastinal contours. No acute infiltrate or edema. No effusion or pneumothorax. Spondylosis and thoracic levocurvature. No acute osseous findings. IMPRESSION: Negative chest. Electronically Signed   By: Marnee Spring M.D.   On: 02/16/2018 13:54      ____________________________________________   PROCEDURES  Procedure(s) performed: None Procedures Critical Care performed:  None ____________________________________________   INITIAL IMPRESSION / ASSESSMENT AND PLAN / ED COURSE  67 y.o. female with a history of asthma, COPD, diabetes, hypertension who presents for evaluation of chest pain.  Patient has an EKG showing left bundle branch block which is unchanged from baseline however she does have new T wave inversions in 1 and aVL.  Old  EKG showed T wave inversions were also present in inferior leads which at this time have resolved.  Patient has had cardiac CT in 09/2016 showing coronary calcium score of 0 in the setting of an abnormal stress test showing partially reversible medium-sized intermittent intensity mid anterior anteroseptal and inferior septal perfusion defect and septal hypokinesis in 08/2016.  Patient received a full dose of aspirin this morning.  Will give nitroglycerin and get labs. Ddx ACS vs PE vs PNA vs pericarditis vs costochondritis. Low suspicion for PE with resolution of her symptoms, no tachycardia, no hypoxia, no tachypnea.  Clinical Course as of Feb 16 1833  Mon Feb 16, 2018  1420 First troponin is negative.  Patient's chest pain has resolved without any intervention.  We will get a second  troponin continue to monitor on telemetry.  Chest x-ray with no infiltrate or pneumothorax.   [CV]  1831 Repeat troponin negative.  Patient remains with no further episodes of chest pain.  Discussed standard return precautions and close follow-up with her cardiologist for further evaluation.   [CV]    Clinical Course User Index [CV] Don PerkingVeronese, [place], MD     As part of my medical decision making, I reviewed the following data within the electronic MEDICAL RECORD NUMBER Nursing notes reviewed and incorporated, Labs reviewed , EKG interpreted , Old EKG reviewed, Old chart reviewed, Radiograph reviewed , Notes from prior ED visits and [place] Controlled Substance Database    Pertinent labs & imaging results that were available during my care of the patient were reviewed by me and considered in my medical decision making (see chart for details).    ____________________________________________   FINAL CLINICAL IMPRESSION(S) / ED DIAGNOSES  Final diagnoses:  Chest pain, unspecified type      NEW MEDICATIONS STARTED DURING THIS VISIT:  ED Discharge Orders    None       Note:  This document was prepared using Dragon voice recognition software and may include unintentional dictation errors.    Nita SickleVeronese, [place], MD 02/16/18 [phone]

## 2018-02-16 NOTE — ED Triage Notes (Signed)
FIRST NURSE NOTE-here for chest pain starting last night. NAD at this time. Pulled next for EKG.

## 2018-03-12 MED FILL — predniSONE 20 MG TABS: 20 | 5 days supply | Qty: 10 | Fill #0

## 2018-03-12 MED FILL — AMOXICILLIN 875 MG TABS: 875 | 7 days supply | Qty: 14 | Fill #0

## 2018-03-13 IMAGING — NM NM MISC PROCEDURE
6 series · 36 of 36 positions shown · non-contrast
Comparison: none

[Series 1: wbr_s-proj_st stress-gsp · 6.40mm/px · 6 of 512 frames shown]
[frame 43/512]
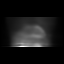
[frame 128/512]
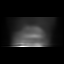
[frame 214/512]
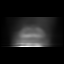
[frame 299/512]
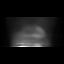
[frame 384/512]
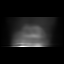
[frame 470/512]
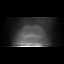

[Series 1: stress-sum-em · 6.40mm/px · 6 of 64 frames shown]
[frame 6/64]
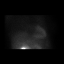
[frame 16/64]
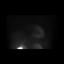
[frame 27/64]
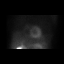
[frame 38/64]
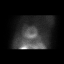
[frame 48/64]
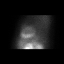
[frame 59/64]
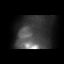

[Series 1: wbr_r-proj_st rest · 6.40mm/px · 6 of 64 frames shown]
[frame 6/64]
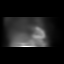
[frame 16/64]
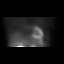
[frame 27/64]
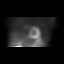
[frame 38/64]
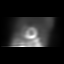
[frame 48/64]
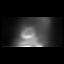
[frame 59/64]
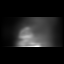

[Series 1: rest · 6.40mm/px · 6 of 64 frames shown]
[frame 6/64]
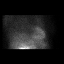
[frame 16/64]
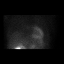
[frame 27/64]
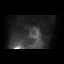
[frame 38/64]
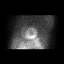
[frame 48/64]
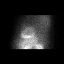
[frame 59/64]
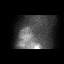

[Series 1: stress-gsp · 6.40mm/px · 6 of 512 frames shown]
[frame 43/512]
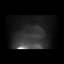
[frame 128/512]
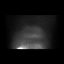
[frame 214/512]
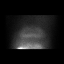
[frame 299/512]
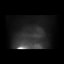
[frame 384/512]
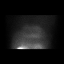
[frame 470/512]
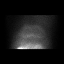

[Series 1: wbr_s-proj_st stress-sum-em · 6.40mm/px · 6 of 64 frames shown]
[frame 6/64]
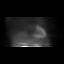
[frame 16/64]
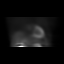
[frame 27/64]
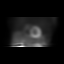
[frame 38/64]
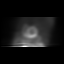
[frame 48/64]
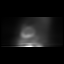
[frame 59/64]
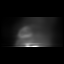

[36 of 36 positions shown; findings below may reference images not displayed]

Canned report from images found in remote index.

Refer to host system for actual result text.

## 2018-04-14 MED FILL — PREDNISOLONE AC 1% EYE DROP: 1 | 25 days supply | Qty: 5 | Fill #0

## 2018-04-16 MED FILL — LOSARTAN POTASSIUM 25 MG TA: 25 | 90 days supply | Qty: 90 | Fill #0

## 2018-04-16 MED FILL — MONTELUKAST SOD 10 MG TAB: 10 | 90 days supply | Qty: 90 | Fill #0

## 2018-04-16 MED FILL — ATORVASTATIN 10 MG TABLET: 10 | 90 days supply | Qty: 90 | Fill #0

## 2018-04-16 MED FILL — AZELASTINE 0.15% NASAL SPRY: 0.15 | 90 days supply | Qty: 30 | Fill #0

## 2018-10-09 MED FILL — LEVOCETIRIZINE 5 MG TABLET: 5 | 90 days supply | Qty: 90 | Fill #0

## 2018-10-09 MED FILL — ALBUTEROL SULFATE HFA 108 (: 108 (90 BAS | 25 days supply | Qty: 18 | Fill #0

## 2018-10-09 MED FILL — OLOPATADINE HCL 0.2 % SOLN: 0.2 | 50 days supply | Qty: 5 | Fill #0

## 2018-10-09 MED FILL — FLUTICASONE PROP 50 MCG SPR: 50 | 60 days supply | Qty: 16 | Fill #0

## 2018-10-21 MED FILL — SHINGRIX 50 MCG SUS: 50 | 1 days supply | Qty: 1 | Fill #0

## 2018-10-22 ENCOUNTER — Emergency Department (HOSPITAL_BASED_OUTPATIENT_CLINIC_OR_DEPARTMENT_OTHER)
Admission: EM | Admit: 2018-10-22 | Discharge: 2018-10-22 | Discharge status: Absent without leave | Payer: Medicare Other

## 2018-11-13 MED FILL — AMLODIPINE BESYLATE 10 MG T: 10 | 90 days supply | Qty: 90 | Fill #0

## 2018-11-13 MED FILL — metFORMIN HCL 500 MG TABS: 500 | 45 days supply | Qty: 180 | Fill #0

## 2019-01-05 ENCOUNTER — Emergency Department (HOSPITAL_BASED_OUTPATIENT_CLINIC_OR_DEPARTMENT_OTHER)
Admission: EM | Admit: 2019-01-05 | Discharge: 2019-01-05 | Disposition: A | Discharge status: Home / Self Care | Payer: Medicare Other | Attending: Emergency Medicine | Admitting: Emergency Medicine

## 2019-01-05 ENCOUNTER — Encounter (HOSPITAL_BASED_OUTPATIENT_CLINIC_OR_DEPARTMENT_OTHER): Payer: Self-pay | Admitting: Emergency Medicine

## 2019-01-05 ENCOUNTER — Emergency Department (HOSPITAL_BASED_OUTPATIENT_CLINIC_OR_DEPARTMENT_OTHER): Payer: Medicare Other

## 2019-01-05 DIAGNOSIS — Z7984 Long term (current) use of oral hypoglycemic drugs: Secondary | ICD-10-CM | POA: Insufficient documentation

## 2019-01-05 DIAGNOSIS — Z79899 Other long term (current) drug therapy: Secondary | ICD-10-CM | POA: Diagnosis not present

## 2019-01-05 DIAGNOSIS — R0789 Other chest pain: Secondary | ICD-10-CM | POA: Diagnosis not present

## 2019-01-05 DIAGNOSIS — J449 Chronic obstructive pulmonary disease, unspecified: Secondary | ICD-10-CM | POA: Diagnosis not present

## 2019-01-05 DIAGNOSIS — R079 Chest pain, unspecified: Secondary | ICD-10-CM

## 2019-01-05 DIAGNOSIS — E119 Type 2 diabetes mellitus without complications: Secondary | ICD-10-CM | POA: Insufficient documentation

## 2019-01-05 DIAGNOSIS — I447 Left bundle-branch block, unspecified: Secondary | ICD-10-CM | POA: Diagnosis not present

## 2019-01-05 DIAGNOSIS — I10 Essential (primary) hypertension: Secondary | ICD-10-CM | POA: Diagnosis not present

## 2019-01-05 DIAGNOSIS — R350 Frequency of micturition: Secondary | ICD-10-CM | POA: Insufficient documentation

## 2019-01-05 LAB — URINALYSIS, ROUTINE W REFLEX MICROSCOPIC
Bilirubin Urine: NEGATIVE
Glucose, UA: NEGATIVE mg/dL
Hgb urine dipstick: NEGATIVE
Ketones, ur: NEGATIVE mg/dL
Leukocytes,Ua: NEGATIVE
Nitrite: NEGATIVE
Protein, ur: NEGATIVE mg/dL
Specific Gravity, Urine: 1.01 (ref 1.005–1.030)
pH: 7.5 (ref 5.0–8.0)

## 2019-01-05 LAB — CBC WITH DIFFERENTIAL/PLATELET
Abs Immature Granulocytes: 0 10*3/uL (ref 0.00–0.07)
Basophils Absolute: 0 10*3/uL (ref 0.0–0.1)
Basophils Relative: 1 %
Eosinophils Absolute: 0.3 10*3/uL (ref 0.0–0.5)
Eosinophils Relative: 8 %
HCT: 43.8 % (ref 36.0–46.0)
Hemoglobin: 13.8 g/dL (ref 12.0–15.0)
Immature Granulocytes: 0 %
Lymphocytes Relative: 40 %
Lymphs Abs: 1.7 10*3/uL (ref 0.7–4.0)
MCH: 26.6 pg (ref 26.0–34.0)
MCHC: 31.5 g/dL (ref 30.0–36.0)
MCV: 84.4 fL (ref 80.0–100.0)
Monocytes Absolute: 0.4 10*3/uL (ref 0.1–1.0)
Monocytes Relative: 11 %
Neutro Abs: 1.6 10*3/uL — ABNORMAL LOW (ref 1.7–7.7)
Neutrophils Relative %: 40 %
Platelets: 238 10*3/uL (ref 150–400)
RBC: 5.19 MIL/uL — ABNORMAL HIGH (ref 3.87–5.11)
RDW: 14.3 % (ref 11.5–15.5)
WBC: 4 10*3/uL (ref 4.0–10.5)
nRBC: 0 % (ref 0.0–0.2)

## 2019-01-05 LAB — COMPREHENSIVE METABOLIC PANEL
ALT: 15 U/L (ref 0–44)
AST: 19 U/L (ref 15–41)
Albumin: 3.9 g/dL (ref 3.5–5.0)
Alkaline Phosphatase: 65 U/L (ref 38–126)
Anion gap: 12 (ref 5–15)
BUN: 10 mg/dL (ref 8–23)
CO2: 26 mmol/L (ref 22–32)
Calcium: 9.4 mg/dL (ref 8.9–10.3)
Chloride: 103 mmol/L (ref 98–111)
Creatinine, Ser: 0.75 mg/dL (ref 0.44–1.00)
GFR calc Af Amer: >60 mL/min (ref >60)
GFR calc non Af Amer: >60 mL/min (ref >60)
Glucose, Bld: 114 mg/dL — ABNORMAL HIGH (ref 70–99)
Potassium: 3.9 mmol/L (ref 3.5–5.1)
Sodium: 141 mmol/L (ref 135–145)
Total Bilirubin: 0.4 mg/dL (ref 0.3–1.2)
Total Protein: 7.6 g/dL (ref 6.5–8.1)

## 2019-01-05 LAB — D-DIMER, QUANTITATIVE: D-Dimer, Quant: 0.63 ug/mL-FEU — ABNORMAL HIGH (ref 0.00–0.50)

## 2019-01-05 NOTE — ED Notes (Signed)
ED Provider at bedside. 

## 2019-01-05 NOTE — ED Provider Notes (Signed)
[facility] EMERGENCY DEPARTMENT Provider Note   CSN: [id number] Arrival date & time: 01/05/19  0446     History   Chief Complaint Chief Complaint  Patient presents with  . Urinary Frequency    HPI Krystal Mckay is a 68 y.o. female.     The history is provided by the patient.  Urinary Frequency This is a recurrent problem. The current episode started more than 2 days ago. The problem occurs constantly. The problem has been gradually worsening. Associated symptoms include chest pain and shortness of breath. Pertinent negatives include no abdominal pain and no headaches. Nothing aggravates the symptoms. Nothing relieves the symptoms.    Past Medical History:  Diagnosis Date  . Asthma   . COPD (chronic obstructive pulmonary disease) (HCC)   . Diabetic peripheral neuropathy (HCC)   . Hypertension   . Insomnia   . LBBB (left bundle branch block)   . Type 2 diabetes mellitus [facility](HCC)     Patient Active Problem List   Diagnosis Date Noted  . Type 2 diabetes mellitus (HCC)   . Diabetic peripheral neuropathy (HCC)   . Hypertension   . Asthma   . COPD (chronic obstructive pulmonary disease) (HCC)   . LBBB (left bundle branch block)     Past Surgical History:  Procedure Laterality Date  . NO PAST SURGERIES       OB History   No obstetric history on file.      Home Medications    Prior to Admission medications   Medication Sig Start Date End Date Taking? Authorizing Provider  acetaminophen (TYLENOL) 500 MG tablet Take 2 tablets (1,000 mg total) by mouth every 8 (eight) hours as needed for moderate pain. 08/25/17   Mackuen, Courteney Lyn, MD  albuterol (PROVENTIL HFA;VENTOLIN HFA) 108 (90 Base) MCG/ACT inhaler Inhale 2 puffs into the lungs every 4 (four) hours as needed for wheezing or shortness of breath.    [provider]  amLODipine (NORVASC) 10 MG tablet Take 10 mg by mouth daily.    [provider]  aspirin EC 81 MG tablet Take 81  mg by mouth daily.    [provider]  Fluticasone-Salmeterol (ADVAIR) 500-50 MCG/DOSE AEPB Inhale 1 puff into the lungs 2 (two) times daily.    [provider]  metFORMIN (GLUCOPHAGE) 500 MG tablet Take 1,000 mg by mouth 2 (two) times daily with a meal.    [provider]  Olopatadine HCl (PATADAY) 0.2 % SOLN Apply to eye.    [provider]    Family History Family History  Problem Relation Age of Onset  . Blindness Father   . Sudden death Brother     Social History Social History   Tobacco Use  . Smoking status: Never Smoker  . Smokeless tobacco: Never Used  Substance Use Topics  . Alcohol use: No  . Drug use: No     Allergies   Patient has no known allergies.   Review of Systems Review of Systems  Respiratory: Positive for shortness of breath.   Cardiovascular: Positive for chest pain.  Gastrointestinal: Negative for abdominal pain.  Genitourinary: Positive for frequency.  Neurological: Negative for headaches.  All other systems reviewed and are negative.    Physical Exam Updated Vital Signs BP (!) 152/90 (BP Location: Right Arm)   Pulse 82   Temp 98.4 F (36.9 C) (Oral)   Resp 18   LMP  (LMP Unknown)   SpO2 93%   Physical Exam Vitals  signs and nursing note reviewed.  Constitutional:      Appearance: She is well-developed.  HENT:     Head: Normocephalic and atraumatic.     Mouth/Throat:     Mouth: Mucous membranes are dry.     Pharynx: Oropharynx is clear.  Eyes:     Extraocular Movements: Extraocular movements intact.     Conjunctiva/sclera: Conjunctivae normal.  Neck:     Musculoskeletal: Normal range of motion.  Cardiovascular:     Rate and Rhythm: Normal rate and regular rhythm.  Pulmonary:     Effort: Pulmonary effort is normal. No respiratory distress.     Breath sounds: No stridor.  Abdominal:     General: There is no distension.  Musculoskeletal:        General: Swelling (L>R, subjectively)  present. No tenderness.  Skin:    General: Skin is warm and dry.  Neurological:     Mental Status: She is alert.     Cranial Nerves: No cranial nerve deficit.  Psychiatric:        Mood and Affect: Mood normal.      ED Treatments / Results  Labs (all labs ordered are listed, but only abnormal results are displayed) Labs Reviewed  CBC WITH DIFFERENTIAL/PLATELET - Abnormal; Notable for the following components:      Result Value   RBC 5.19 (*)    Neutro Abs 1.6 (*)    All other components within normal limits  COMPREHENSIVE METABOLIC PANEL - Abnormal; Notable for the following components:   Glucose, Bld 114 (*)    All other components within normal limits  D-DIMER, QUANTITATIVE (NOT AT [facility]) - Abnormal; Notable for the following components:   D-Dimer, Quant 0.63 (*)    All other components within normal limits  URINE CULTURE  URINALYSIS, ROUTINE W REFLEX MICROSCOPIC    EKG EKG Interpretation  Date/Time:  Tuesday January 05 2019 05:59:16 EDT Ventricular Rate:  69 PR Interval:    QRS Duration: 151 QT Interval:  445 QTC Calculation: 477 R Axis:   97 Text Interpretation: Sinus rhythm Left bundle branch block No significant change since last tracing Confirmed by Merrily Pew [phone]) on 01/05/2019 6:15:31 AM   Radiology Dg Chest Portable 1 View  Result Date: 01/05/2019 CLINICAL DATA:  Urinary frequency and chest pain for 2 days. EXAM: PORTABLE CHEST 1 VIEW COMPARISON:  Chest x-ray 02/16/2018 FINDINGS: The cardiac silhouette, mediastinal and hilar contours are normal. The lungs are clear. No pleural effusions. No pulmonary lesions. The bony thorax is intact. IMPRESSION: No acute cardiopulmonary findings. Electronically Signed   By: Marijo Sanes M.D.   On: 01/05/2019 06:14    Procedures Procedures (including critical care time)  Medications Ordered in ED Medications - No data to display   Initial Impression / Assessment and Plan / ED Course  I have reviewed the  triage vital signs and the nursing notes.  Pertinent labs & imaging results that were available during my care of the patient were reviewed by me and considered in my medical decision making (see chart for details).        Patient here for urinary frequency.  Soundly the patient had about a year ago was started on antibiotics and it made it better but then it came back a few months ago and specifically last 4 days progressively worsened to the point where she can barely sleep last night because of how frequently she had to urinate.  We will check a urine if this is normal  we will check kidney function otherwise can follow-up with PCP.  Patient also complains of chest pain.  She states that she has severe chest pain especially right now.  She does not appear to be any distress but she is 68 years old with history of diabetes, hypertension, obesity.  Her oxygen saturation is 93% on arrival here but she also states that she has a left leg that seems like it swollen to her so get a D-dimer, EKG, chest x-ray.  Low suspicion for ACS as she has had this worked up before in the past to include stress test that was negative.  She actually has a appointment with her gastroenterologist coming soon to further evaluate epigastric pain which could be related to this.  D dimer acceptable for age adjusted testing. Ecg/cxr unchanged. Rest of labs are reassurring. Will refer to urology and continued PCP evaluation.  Final Clinical Impressions(s) / ED Diagnoses   Final diagnoses:  Urinary frequency  Chest pain, unspecified type    ED Discharge Orders         Ordered    Ambulatory referral to Urology     01/05/19 0629           Marily Memos, MD 01/05/19 0630

## 2019-01-05 NOTE — ED Triage Notes (Signed)
Pt states she is having urinary frequency  States she has to go every 10-20 minutes  Denies any burning with urination

## 2019-01-06 LAB — URINE CULTURE: Culture: <10000 — AB

## 2019-01-12 MED FILL — SHINGRIX 50 MCG SUS: 50 | 1 days supply | Qty: 1 | Fill #1

## 2019-01-12 MED FILL — OLOPATADINE HCL 0.2 % SOLN: 0.2 | 50 days supply | Qty: 5 | Fill #1

## 2019-01-12 MED FILL — LEVOCETIRIZINE 5 MG TABLET: 5 | 90 days supply | Qty: 90 | Fill #1

## 2019-01-13 MED FILL — FLUTICASONE PROP 50 MCG SPR: 50 | 60 days supply | Qty: 16 | Fill #1

## 2019-02-05 MED FILL — ALBUTEROL SULFATE HFA 108 (: 108 (90 BAS | 25 days supply | Qty: 18 | Fill #1

## 2019-02-14 ENCOUNTER — Other Ambulatory Visit: Payer: Self-pay

## 2019-02-14 ENCOUNTER — Emergency Department (HOSPITAL_BASED_OUTPATIENT_CLINIC_OR_DEPARTMENT_OTHER)
Admission: EM | Admit: 2019-02-14 | Discharge: 2019-02-14 | Disposition: A | Discharge status: Home / Self Care | Payer: Medicare Other | Attending: Emergency Medicine | Admitting: Emergency Medicine

## 2019-02-14 ENCOUNTER — Encounter (HOSPITAL_BASED_OUTPATIENT_CLINIC_OR_DEPARTMENT_OTHER): Payer: Self-pay | Admitting: Emergency Medicine

## 2019-02-14 DIAGNOSIS — U071 COVID-19: Secondary | ICD-10-CM | POA: Diagnosis not present

## 2019-02-14 DIAGNOSIS — E119 Type 2 diabetes mellitus without complications: Secondary | ICD-10-CM | POA: Insufficient documentation

## 2019-02-14 DIAGNOSIS — J449 Chronic obstructive pulmonary disease, unspecified: Secondary | ICD-10-CM | POA: Diagnosis not present

## 2019-02-14 DIAGNOSIS — Z7982 Long term (current) use of aspirin: Secondary | ICD-10-CM | POA: Diagnosis not present

## 2019-02-14 DIAGNOSIS — Z7984 Long term (current) use of oral hypoglycemic drugs: Secondary | ICD-10-CM | POA: Diagnosis not present

## 2019-02-14 DIAGNOSIS — R350 Frequency of micturition: Secondary | ICD-10-CM | POA: Diagnosis present

## 2019-02-14 DIAGNOSIS — I10 Essential (primary) hypertension: Secondary | ICD-10-CM | POA: Insufficient documentation

## 2019-02-14 DIAGNOSIS — J45909 Unspecified asthma, uncomplicated: Secondary | ICD-10-CM | POA: Diagnosis not present

## 2019-02-14 LAB — CBC
HCT: 40.8 % (ref 36.0–46.0)
Hemoglobin: 12.8 g/dL (ref 12.0–15.0)
MCH: 25.6 pg — ABNORMAL LOW (ref 26.0–34.0)
MCHC: 31.4 g/dL (ref 30.0–36.0)
MCV: 81.6 fL (ref 80.0–100.0)
Platelets: 252 10*3/uL (ref 150–400)
RBC: 5 MIL/uL (ref 3.87–5.11)
RDW: 13.8 % (ref 11.5–15.5)
WBC: 5.3 10*3/uL (ref 4.0–10.5)
nRBC: 0 % (ref 0.0–0.2)

## 2019-02-14 LAB — BASIC METABOLIC PANEL
Anion gap: 11 (ref 5–15)
BUN: 5 mg/dL — ABNORMAL LOW (ref 8–23)
CO2: 24 mmol/L (ref 22–32)
Calcium: 9.3 mg/dL (ref 8.9–10.3)
Chloride: 102 mmol/L (ref 98–111)
Creatinine, Ser: 0.62 mg/dL (ref 0.44–1.00)
GFR calc Af Amer: >60 mL/min (ref >60)
GFR calc non Af Amer: >60 mL/min (ref >60)
Glucose, Bld: 151 mg/dL — ABNORMAL HIGH (ref 70–99)
Potassium: 3.7 mmol/L (ref 3.5–5.1)
Sodium: 137 mmol/L (ref 135–145)

## 2019-02-14 LAB — URINALYSIS, ROUTINE W REFLEX MICROSCOPIC
Bilirubin Urine: NEGATIVE
Glucose, UA: NEGATIVE mg/dL
Hgb urine dipstick: NEGATIVE
Ketones, ur: NEGATIVE mg/dL
Leukocytes,Ua: NEGATIVE
Nitrite: NEGATIVE
Protein, ur: NEGATIVE mg/dL
Specific Gravity, Urine: <1.005 — ABNORMAL LOW (ref 1.005–1.030)
pH: 6.5 (ref 5.0–8.0)

## 2019-02-14 MED ORDER — SODIUM CHLORIDE 0.9 % IV SOLN
1000.0000 mL | INTRAVENOUS | Status: DC
  Administered 2019-02-14: 1000 mL via INTRAVENOUS

## 2019-02-14 MED ORDER — SODIUM CHLORIDE 0.9 % IV BOLUS (SEPSIS)
500.0000 mL | Freq: Once | INTRAVENOUS | Status: AC
  Administered 2019-02-14: 500 mL via INTRAVENOUS

## 2019-02-14 NOTE — ED Triage Notes (Signed)
Urinary frequency x 1 week, denies dysuria.

## 2019-02-14 NOTE — Discharge Instructions (Signed)
I reviewed the test results from your doctors office visit on December 1.  You did test positive for Covid virus infection.  Make sure to stay home and remain quarantined.  Contact your doctor to see when you can return to normal activity.  Return to an emergency room if you start having difficulty breathing.

## 2019-02-14 NOTE — ED Notes (Signed)
Pt in restroom 

## 2019-02-14 NOTE — ED Provider Notes (Signed)
[facility] EMERGENCY DEPARTMENT Provider Note   CSN: [id number] Arrival date & time: 02/14/19  5956     History   Chief Complaint Chief Complaint  Patient presents with  . Urinary Frequency    HPI Krystal Mckay is a 68 y.o. female.     HPI Patient presents to the emergency room for evaluation of urinary frequency.  Patient states she had an episode of the same symptoms back in October.  She was seen in the emergency room.  According to the records there were no signs of a urinary tract infection and she was referred to urology.  Patient states she has an appointment scheduled this month but has not seen the urologist yet.  Patient states her symptoms resolved until yesterday when she started having urinary frequency again.  Patient has been urinating frequently all night long.  She feels that she is getting weak as a result of that.  She has not had any trouble with fevers or chills.  No vomiting or diarrhea.  No vaginal discharge.  No focal numbness or weakness. Past Medical History:  Diagnosis Date  . Asthma   . COPD (chronic obstructive pulmonary disease) ([place])   . Diabetic peripheral neuropathy ([place])   . Hypertension   . Insomnia   . LBBB (left bundle branch block)   . Type 2 diabetes mellitus [facility])     Patient Active Problem List   Diagnosis Date Noted  . Type 2 diabetes mellitus ([place])   . Diabetic peripheral neuropathy ([place])   . Hypertension   . Asthma   . COPD (chronic obstructive pulmonary disease) ([place])   . LBBB (left bundle branch block)     Past Surgical History:  Procedure Laterality Date  . NO PAST SURGERIES       OB History   No obstetric history on file.      Home Medications    Prior to Admission medications   Medication Sig Start Date End Date Taking? Authorizing Provider  acetaminophen (TYLENOL) 500 MG tablet Take 2 tablets (1,000 mg total) by mouth every 8 (eight) hours as needed for moderate pain. 08/25/17   Mackuen,  Courteney Lyn, MD  albuterol (PROVENTIL HFA;VENTOLIN HFA) 108 (90 Base) MCG/ACT inhaler Inhale 2 puffs into the lungs every 4 (four) hours as needed for wheezing or shortness of breath.    [provider]  amLODipine (NORVASC) 10 MG tablet Take 10 mg by mouth daily.    [provider]  aspirin EC 81 MG tablet Take 81 mg by mouth daily.    [provider]  Fluticasone-Salmeterol (ADVAIR) 500-50 MCG/DOSE AEPB Inhale 1 puff into the lungs 2 (two) times daily.    [provider]  metFORMIN (GLUCOPHAGE) 500 MG tablet Take 1,000 mg by mouth 2 (two) times daily with a meal.    [provider]  Olopatadine HCl (PATADAY) 0.2 % SOLN Apply to eye.    [provider]    Family History Family History  Problem Relation Age of Onset  . Blindness Father   . Sudden death Brother     Social History Social History   Tobacco Use  . Smoking status: Never Smoker  . Smokeless tobacco: Never Used  Substance Use Topics  . Alcohol use: No  . Drug use: No     Allergies   Patient has no known allergies.   Review of Systems Review of Systems  Constitutional: Negative for fever.  Respiratory: Negative for cough and shortness of  breath.   Cardiovascular: Negative for chest pain.  Gastrointestinal: Negative for abdominal pain.  Genitourinary: Positive for dysuria and frequency. Negative for vaginal bleeding and vaginal discharge.  Neurological:       No speech difficulties, patient is able to walk without difficulty  All other systems reviewed and are negative.    Physical Exam Updated Vital Signs BP (!) 147/58   Pulse 89   Temp 98.8 F (37.1 C)   Resp 20   Ht 1.651 m ( )   LMP  (LMP Unknown)   SpO2 99%   BMI 29.95 kg/m   Physical Exam Vitals signs and nursing note reviewed.  Constitutional:      General: She is not in acute distress.    Appearance: She is well-developed.  HENT:     Head: Normocephalic and atraumatic.      Right Ear: External ear normal.     Left Ear: External ear normal.  Eyes:     General: No scleral icterus.       Right eye: No discharge.        Left eye: No discharge.     Conjunctiva/sclera: Conjunctivae normal.  Neck:     Musculoskeletal: Neck supple.     Trachea: No tracheal deviation.  Cardiovascular:     Rate and Rhythm: Normal rate and regular rhythm.  Pulmonary:     Effort: Pulmonary effort is normal. No respiratory distress.     Breath sounds: Normal breath sounds. No stridor. No wheezing or rales.  Abdominal:     General: Bowel sounds are normal. There is no distension.     Palpations: Abdomen is soft.     Tenderness: There is no abdominal tenderness. There is no guarding or rebound.  Musculoskeletal:        General: No tenderness.  Skin:    General: Skin is warm and dry.     Findings: No rash.  Neurological:     Mental Status: She is alert.     Cranial Nerves: No cranial nerve deficit (no facial droop, extraocular movements intact, no slurred speech).     Sensory: No sensory deficit.     Motor: No abnormal muscle tone or seizure activity.     Coordination: Coordination normal.      ED Treatments / Results  Labs (all labs ordered are listed, but only abnormal results are displayed) Labs Reviewed  URINALYSIS, ROUTINE W REFLEX MICROSCOPIC - Abnormal; Notable for the following components:      Result Value   Color, Urine STRAW (*)    Specific Gravity, Urine <1.005 (*)    All other components within normal limits  CBC - Abnormal; Notable for the following components:   MCH 25.6 (*)    All other components within normal limits  BASIC METABOLIC PANEL - Abnormal; Notable for the following components:   Glucose, Bld 151 (*)    BUN 5 (*)    All other components within normal limits     Procedures Procedures (including critical care time)  Medications Ordered in ED Medications  sodium chloride 0.9 % bolus 500 mL (has no administration in time range)     Followed by  0.9 %  sodium chloride infusion (has no administration in time range)     Initial Impression / Assessment and Plan / ED Course  I have reviewed the triage vital signs and the nursing notes.  Pertinent labs & imaging results that were available during my care of the patient were reviewed by  me and considered in my medical decision making (see chart for details).     Laboratory tests reviewed.  No signs of urinary tract infection.  Patient's electrolyte panel is unremarkable.  Patient's urinary specific gravity is low but I doubt diabetes insipidus with normal electrolytes.  Patient has outpatient follow-up scheduled with urology.  She appears stable to continue without evaluation.  Of note, patient indicated that she has been feeling weak.  Reviewed laboratory tests from her doctor's appointment on December 1.  Patient tested positive for Covid.  Currently she has been having some trouble with cough and congestion.  Patient is not having any respiratory difficulty here.  She did not mention having any issues with shortness of breath.  Her oxygen saturation is normal.  I will inform her of her test result.  She is stable for discharge.  Lab tests reviewed from PCP visit on Dec 1st.  Covid test is positive.   Krystal Mckay was evaluated in Emergency Department on 02/14/2019 for the symptoms described in the history of present illness. She was evaluated in the context of the global COVID-19 pandemic, which necessitated consideration that the patient might be at risk for infection with the SARS-CoV-2 virus that causes COVID-19. Institutional protocols and algorithms that pertain to the evaluation of patients at risk for COVID-19 are in a state of rapid change based on information released by regulatory bodies including the CDC and federal and state organizations. These policies and algorithms were followed during the patient's care in the ED.  Final Clinical Impressions(s) / ED Diagnoses    Final diagnoses:  COVID-19 virus infection    ED Discharge Orders    None       Linwood Dibbles, MD 02/14/19 1112

## 2019-04-02 MED FILL — FLUTICASONE PROP 50 MCG SPR: 50 | 60 days supply | Qty: 16 | Fill #2

## 2019-04-02 MED FILL — OLOPATADINE HCL 0.2 % SOLN: 0.2 | 50 days supply | Qty: 5 | Fill #2

## 2019-04-02 MED FILL — LEVOCETIRIZINE 5 MG TABLET: 5 | 90 days supply | Qty: 90 | Fill #2

## 2019-04-02 MED FILL — ALBUTEROL SULFATE HFA 108 (: 108 (90 BAS | 25 days supply | Qty: 18 | Fill #2

## 2019-05-27 ENCOUNTER — Ambulatory Visit: Payer: Medicare Other | Attending: Internal Medicine

## 2019-05-27 DIAGNOSIS — Z23 Encounter for immunization: Secondary | ICD-10-CM

## 2019-05-27 NOTE — Progress Notes (Signed)
   Covid-19 Vaccination Clinic  Name:  [name]    MRN: [id number] DOB: [birth year]  05/27/2019  [name] was observed post Covid-19 immunization for 15 minutes without incident. She was provided with Vaccine Information Sheet and instruction to access the V-Safe system.   [name] was instructed to call 911 with any severe reactions post vaccine: Marland Kitchen Difficulty breathing  . Swelling of face and throat  . A fast heartbeat  . A bad rash all over body  . Dizziness and weakness   Immunizations Administered    Name Date Dose VIS Date Route   Pfizer COVID-19 Vaccine 05/27/2019 11:03 AM 0.3 mL 02/19/2019 Intramuscular   Manufacturer: [employer], [employer]   Lot: [id number]   NDC: [address]-7

## 2019-06-21 ENCOUNTER — Ambulatory Visit: Payer: Medicare Other | Attending: Internal Medicine

## 2019-06-21 DIAGNOSIS — Z23 Encounter for immunization: Secondary | ICD-10-CM

## 2019-06-21 NOTE — Progress Notes (Signed)
   Covid-19 Vaccination Clinic  Name:  Basil Blakesley    MRN: [id number] DOB: [birth year]  06/21/2019  Ms. Fry was observed post Covid-19 immunization for 15 minutes without incident. She was provided with Vaccine Information Sheet and instruction to access the V-Safe system.   Ms. Hanford was instructed to call 911 with any severe reactions post vaccine: Marland Kitchen Difficulty breathing  . Swelling of face and throat  . A fast heartbeat  . A bad rash all over body  . Dizziness and weakness   Immunizations Administered    Name Date Dose VIS Date Route   Pfizer COVID-19 Vaccine 06/21/2019 11:02 AM 0.3 mL 02/19/2019 Intramuscular   Manufacturer: [employer], [employer]   Lot: [id number]   NDC: [address]-6

## 2019-07-08 MED FILL — LEVOCETIRIZINE 5 MG TABLET: 5 | 90 days supply | Qty: 90 | Fill #3

## 2019-07-08 MED FILL — METFORMIN HCL 500 MG TABS: 500 | 45 days supply | Qty: 180 | Fill #1

## 2019-07-08 MED FILL — OLOPATADINE HCL 0.2 % SOLN: 0.2 | 50 days supply | Qty: 5 | Fill #3

## 2019-10-26 ENCOUNTER — Other Ambulatory Visit (HOSPITAL_BASED_OUTPATIENT_CLINIC_OR_DEPARTMENT_OTHER): Payer: Self-pay | Admitting: Family Medicine

## 2019-10-26 MED FILL — LEVOCETIRIZINE 5 MG TABLET: 5 | 90 days supply | Qty: 90 | Fill #0

## 2019-10-29 MED FILL — OLOPATADINE HCL 0.2 % SOLN: 0.2 | 50 days supply | Qty: 5 | Fill #0

## 2020-01-24 MED FILL — LEVOCETIRIZINE 5 MG TABLET: 5 | 90 days supply | Qty: 90 | Fill #1

## 2020-01-31 ENCOUNTER — Ambulatory Visit: Payer: Medicare Other | Attending: Internal Medicine

## 2020-01-31 ENCOUNTER — Other Ambulatory Visit (HOSPITAL_BASED_OUTPATIENT_CLINIC_OR_DEPARTMENT_OTHER): Payer: Self-pay | Admitting: Internal Medicine

## 2020-01-31 DIAGNOSIS — Z23 Encounter for immunization: Secondary | ICD-10-CM

## 2020-01-31 MED FILL — PFIZER-BIONTECH COVID-19 VA: 30 | 1 days supply | Qty: 0 | Fill #0

## 2020-01-31 NOTE — Progress Notes (Signed)
   Covid-19 Vaccination Clinic  Name:  Tora Prunty    MRN: [id number] DOB: [birth year]  01/31/2020  Ms. Derocher was observed post Covid-19 immunization for 15 minutes without incident. She was provided with Vaccine Information Sheet and instruction to access the V-Safe system.   Ms. Burggraf was instructed to call 911 with any severe reactions post vaccine: Marland Kitchen Difficulty breathing  . Swelling of face and throat  . A fast heartbeat  . A bad rash all over body  . Dizziness and weakness   Immunizations Administered    Name Date Dose VIS Date Route   Pfizer COVID-19 Vaccine 01/31/2020 11:37 AM 0.3 mL 12/29/2019 Intramuscular   Manufacturer: [employer], [employer]   Lot: [id number]   NDC: [address]-0     Vaccine given by Arletha Pili, pharmacy student

## 2020-04-14 MED FILL — LEVOCETIRIZINE 5 MG TABLET: 5 | 90 days supply | Qty: 90 | Fill #2

## 2020-06-26 ENCOUNTER — Other Ambulatory Visit (HOSPITAL_BASED_OUTPATIENT_CLINIC_OR_DEPARTMENT_OTHER): Payer: Self-pay

## 2020-12-05 ENCOUNTER — Other Ambulatory Visit: Payer: Self-pay

## 2021-04-13 ENCOUNTER — Other Ambulatory Visit: Payer: Self-pay

## 2021-04-13 ENCOUNTER — Emergency Department (HOSPITAL_BASED_OUTPATIENT_CLINIC_OR_DEPARTMENT_OTHER): Payer: Medicare Other

## 2021-04-13 ENCOUNTER — Encounter (HOSPITAL_BASED_OUTPATIENT_CLINIC_OR_DEPARTMENT_OTHER): Payer: Self-pay | Admitting: *Deleted

## 2021-04-13 ENCOUNTER — Emergency Department (HOSPITAL_BASED_OUTPATIENT_CLINIC_OR_DEPARTMENT_OTHER)
Admission: EM | Admit: 2021-04-13 | Discharge: 2021-04-14 | Disposition: A | Discharge status: Home / Self Care | Payer: Medicare Other | Attending: Emergency Medicine | Admitting: Emergency Medicine

## 2021-04-13 DIAGNOSIS — R0602 Shortness of breath: Secondary | ICD-10-CM | POA: Diagnosis present

## 2021-04-13 DIAGNOSIS — Z20822 Contact with and (suspected) exposure to covid-19: Secondary | ICD-10-CM | POA: Insufficient documentation

## 2021-04-13 DIAGNOSIS — Z7982 Long term (current) use of aspirin: Secondary | ICD-10-CM | POA: Insufficient documentation

## 2021-04-13 DIAGNOSIS — Z79899 Other long term (current) drug therapy: Secondary | ICD-10-CM | POA: Insufficient documentation

## 2021-04-13 DIAGNOSIS — E119 Type 2 diabetes mellitus without complications: Secondary | ICD-10-CM | POA: Diagnosis not present

## 2021-04-13 DIAGNOSIS — I10 Essential (primary) hypertension: Secondary | ICD-10-CM | POA: Diagnosis not present

## 2021-04-13 DIAGNOSIS — J441 Chronic obstructive pulmonary disease with (acute) exacerbation: Secondary | ICD-10-CM

## 2021-04-13 DIAGNOSIS — R059 Cough, unspecified: Secondary | ICD-10-CM | POA: Insufficient documentation

## 2021-04-13 DIAGNOSIS — J449 Chronic obstructive pulmonary disease, unspecified: Secondary | ICD-10-CM | POA: Insufficient documentation

## 2021-04-13 DIAGNOSIS — Z7984 Long term (current) use of oral hypoglycemic drugs: Secondary | ICD-10-CM | POA: Insufficient documentation

## 2021-04-13 DIAGNOSIS — R0789 Other chest pain: Secondary | ICD-10-CM | POA: Insufficient documentation

## 2021-04-13 LAB — CBC WITH DIFFERENTIAL/PLATELET
Abs Immature Granulocytes: 0.01 10*3/uL (ref 0.00–0.07)
Basophils Absolute: 0 10*3/uL (ref 0.0–0.1)
Basophils Relative: 1 %
Eosinophils Absolute: 0.3 10*3/uL (ref 0.0–0.5)
Eosinophils Relative: 5 %
HCT: 40.4 % (ref 36.0–46.0)
Hemoglobin: 13.1 g/dL (ref 12.0–15.0)
Immature Granulocytes: 0 %
Lymphocytes Relative: 30 %
Lymphs Abs: 1.6 10*3/uL (ref 0.7–4.0)
MCH: 26.3 pg (ref 26.0–34.0)
MCHC: 32.4 g/dL (ref 30.0–36.0)
MCV: 81.1 fL (ref 80.0–100.0)
Monocytes Absolute: 0.5 10*3/uL (ref 0.1–1.0)
Monocytes Relative: 9 %
Neutro Abs: 3 10*3/uL (ref 1.7–7.7)
Neutrophils Relative %: 55 %
Platelets: 197 10*3/uL (ref 150–400)
RBC: 4.98 MIL/uL (ref 3.87–5.11)
RDW: 16.1 % — ABNORMAL HIGH (ref 11.5–15.5)
WBC: 5.4 10*3/uL (ref 4.0–10.5)
nRBC: 0 % (ref 0.0–0.2)

## 2021-04-13 LAB — TROPONIN I (HIGH SENSITIVITY): Troponin I (High Sensitivity): <2 ng/L (ref <18)

## 2021-04-13 LAB — BASIC METABOLIC PANEL
Anion gap: 10 (ref 5–15)
BUN: 12 mg/dL (ref 8–23)
CO2: 23 mmol/L (ref 22–32)
Calcium: 9.4 mg/dL (ref 8.9–10.3)
Chloride: 102 mmol/L (ref 98–111)
Creatinine, Ser: 0.75 mg/dL (ref 0.44–1.00)
GFR, Estimated: >60 mL/min (ref >60)
Glucose, Bld: 91 mg/dL (ref 70–99)
Potassium: 3.6 mmol/L (ref 3.5–5.1)
Sodium: 135 mmol/L (ref 135–145)

## 2021-04-13 LAB — RESP PANEL BY RT-PCR (FLU A&B, COVID) ARPGX2
Influenza A by PCR: NEGATIVE
Influenza B by PCR: NEGATIVE
SARS Coronavirus 2 by RT PCR: NEGATIVE

## 2021-04-13 NOTE — ED Triage Notes (Signed)
Sob this am. Pain in her lungs since this am. Oxygen sats are 98%. Able to speak in complete sentences.

## 2021-04-13 NOTE — ED Provider Notes (Signed)
[facility] EMERGENCY DEPARTMENT Provider Note   CSN: [id number] Arrival date & time: 04/13/21  1910     History  Chief Complaint  Patient presents with   Shortness of Breath    Krystal Mckay is a 71 y.o. female.  HPI  71 year old female who is primarily [place] speaking but understands English well with past medical history of COPD, left bundle branch block, HTN, DM presents to the emergency department with concern for shortness of breath, nonproductive cough, diffuse chest discomfort.  History is somewhat limited due to language barrier.  We have not been able to find a language on the interpreter services that she is comfortable with.  She does understand English and is able to answer questions.  States that over the last day she has had a dry cough, diffuse chest heaviness.  No swelling of her lower extremities.  No fever.  States has been compliant with her medications.  No recent travel.  No sick contacts.  Home Medications Prior to Admission medications   Medication Sig Start Date End Date Taking? Authorizing Provider  acetaminophen (TYLENOL) 500 MG tablet Take 2 tablets (1,000 mg total) by mouth every 8 (eight) hours as needed for moderate pain. 08/25/17   Mackuen, Courteney Lyn, MD  albuterol (PROVENTIL HFA;VENTOLIN HFA) 108 (90 Base) MCG/ACT inhaler Inhale 2 puffs into the lungs every 4 (four) hours as needed for wheezing or shortness of breath.    [provider]  amLODipine (NORVASC) 10 MG tablet Take 10 mg by mouth daily.    [provider]  aspirin EC 81 MG tablet Take 81 mg by mouth daily.    [provider]  Fluticasone-Salmeterol (ADVAIR) 500-50 MCG/DOSE AEPB Inhale 1 puff into the lungs 2 (two) times daily.    [provider]  levocetirizine (XYZAL) 5 MG tablet TAKE 1 TABLET BY MOUTH EVERY EVERY EVENING 10/26/19 10/25/20  Jackalyn Lombard I, MD  metFORMIN (GLUCOPHAGE) 500 MG tablet Take 1,000 mg by mouth 2 (two) times  daily with a meal.    [provider]  Olopatadine HCl (PATADAY) 0.2 % SOLN Apply to eye.    [provider]      Allergies    Patient has no known allergies.    Review of Systems   Review of Systems  Constitutional:  Positive for fatigue. Negative for fever.  Respiratory:  Positive for cough and chest tightness. Negative for shortness of breath.   Cardiovascular:  Negative for chest pain, palpitations and leg swelling.  Gastrointestinal:  Negative for abdominal pain, diarrhea and vomiting.  Skin:  Negative for rash.  Neurological:  Negative for headaches.   Physical Exam Updated Vital Signs BP 132/71 (BP Location: Right Arm)    Pulse 72    Temp 98 F (36.7 C) (Oral)    Resp 20    Ht 5\' 5"  (1.651 m)    Wt 81.6 kg    LMP  (LMP Unknown)    SpO2 97%    BMI 29.95 kg/m  Physical Exam Vitals and nursing note reviewed.  Constitutional:      General: She is not in acute distress.    Appearance: Normal appearance. She is not diaphoretic.  HENT:     Head: Normocephalic.     Mouth/Throat:     Mouth: Mucous membranes are moist.  Cardiovascular:     Rate and Rhythm: Normal rate.  Pulmonary:     Effort: Pulmonary effort is normal. No tachypnea or respiratory distress.  Breath sounds: Examination of the right-lower field reveals decreased breath sounds. Examination of the left-lower field reveals decreased breath sounds. Decreased breath sounds present. No wheezing.  Abdominal:     Palpations: Abdomen is soft.     Tenderness: There is no abdominal tenderness.  Musculoskeletal:     Right lower leg: No edema.     Left lower leg: No edema.  Skin:    General: Skin is warm.  Neurological:     Mental Status: She is alert and oriented to person, place, and time. Mental status is at baseline.  Psychiatric:        Mood and Affect: Mood normal.    ED Results / Procedures / Treatments   Labs (all labs ordered are listed, but only abnormal results are displayed) Labs  Reviewed  RESP PANEL BY RT-PCR (FLU A&B, COVID) [id number]  CBC WITH DIFFERENTIAL/PLATELET  BASIC METABOLIC PANEL  BRAIN NATRIURETIC PEPTIDE  TROPONIN I (HIGH SENSITIVITY)    EKG EKG Interpretation  Date/Time:  Friday April 13 2021 19:29:28 EST Ventricular Rate:  74 PR Interval:  140 QRS Duration: 148 QT Interval:  426 QTC Calculation: 472 R Axis:   30 Text Interpretation: Normal sinus rhythm Left bundle branch block Abnormal ECG When compared with ECG of 05-Jan-2019 05:59, PREVIOUS ECG IS PRESENT LBBB old Confirmed by Coralee Pesa [phone]) on 04/13/2021 7:32:39 PM  Radiology DG Chest Port 1 View  Result Date: 04/13/2021 CLINICAL DATA:  Cough and shortness of breath. EXAM: PORTABLE CHEST 1 VIEW COMPARISON:  11/15/2020 FINDINGS: Heart size and pulmonary vascularity are normal. Linear atelectasis in the left base. Lungs are otherwise clear. No pleural effusions. No pneumothorax. Mediastinal contours appear intact. IMPRESSION: Linear atelectasis in the left base.  No focal consolidation. Electronically Signed   By: Burman Nieves M.D.   On: 04/13/2021 22:47    Procedures Procedures    Medications Ordered in ED Medications - No data to display  ED Course/ Medical Decision Making/ A&P                           Medical Decision Making Amount and/or Complexity of Data Reviewed Labs: ordered. Radiology: ordered.   71 year old female presents emergency department with shortness of breath, nonproductive cough, chest tightness.  Vitals are stable on arrival, normal oxygenation on room air, no respiratory distress.  Lung sounds are rather unremarkable.  Chest x-ray shows no acute finding.  Plan for lab evaluation, cardiac rule out.  Patient signed out pending labs and reevaluation.        Final Clinical Impression(s) / ED Diagnoses Final diagnoses:  None    Rx / DC Orders ED Discharge Orders     None         Rozelle Logan, DO 04/13/21 2311

## 2021-04-14 DIAGNOSIS — R0602 Shortness of breath: Secondary | ICD-10-CM | POA: Diagnosis not present

## 2021-04-14 LAB — BRAIN NATRIURETIC PEPTIDE: B Natriuretic Peptide: 24 pg/mL (ref 0.0–100.0)

## 2021-04-14 MED ORDER — ALBUTEROL SULFATE HFA 108 (90 BASE) MCG/ACT IN AERS
2.0000 | INHALATION_SPRAY | RESPIRATORY_TRACT | Status: DC | PRN
  Administered 2021-04-14: 2 via RESPIRATORY_TRACT

## 2021-04-14 MED ORDER — ALBUTEROL SULFATE HFA 108 (90 BASE) MCG/ACT IN AERS: 2.0000 | INHALATION_SPRAY | Freq: Once | RESPIRATORY_TRACT | Status: DC

## 2021-04-14 MED ORDER — AEROCHAMBER PLUS FLO-VU MISC
1.0000 | Freq: Once | Status: AC
Start: 2021-04-14 — End: 2021-04-14
  Administered 2021-04-14: 1

## 2021-04-14 MED ORDER — ALBUTEROL SULFATE HFA 108 (90 BASE) MCG/ACT IN AERS
INHALATION_SPRAY | RESPIRATORY_TRACT | Status: AC
  Filled 2021-04-14: qty 6.7

## 2021-04-14 NOTE — ED Notes (Signed)
Gave Pt. Chamber to use with inhaler.  Teaching done with Pt.  Pt. Encouraged to use and also encouraged to see her PMD.  Pt. In no distress at time of discharge

## 2021-04-14 NOTE — ED Provider Notes (Addendum)
Nursing notes and vitals signs, including pulse oximetry, reviewed.  Summary of this visit's results, reviewed by myself:  EKG:  EKG Interpretation  Date/Time:  Friday April 13 2021 19:29:28 EST Ventricular Rate:  74 PR Interval:  140 QRS Duration: 148 QT Interval:  426 QTC Calculation: 472 R Axis:   30 Text Interpretation: Normal sinus rhythm Left bundle branch block Abnormal ECG When compared with ECG of 05-Jan-2019 05:59, PREVIOUS ECG IS PRESENT LBBB old Confirmed by Lavenia Atlas [phone]) on 04/13/2021 7:32:39 PM        Labs:  Results for orders placed or performed during the hospital encounter of 04/13/21 (from the past 24 hour(s))  Resp Panel by RT-PCR (Flu A&B, Covid) Nasopharyngeal Swab     Status: None   Collection Time: 04/13/21  7:22 PM   Specimen: Nasopharyngeal Swab; Nasopharyngeal(NP) swabs in vial transport medium  Result Value Ref Range   SARS Coronavirus 2 by RT PCR NEGATIVE NEGATIVE   Influenza A by PCR NEGATIVE NEGATIVE   Influenza B by PCR NEGATIVE NEGATIVE  CBC with Differential     Status: Abnormal   Collection Time: 04/13/21 11:08 PM  Result Value Ref Range   WBC 5.4 4.0 - 10.5 K/uL   RBC 4.98 3.87 - 5.11 MIL/uL   Hemoglobin 13.1 12.0 - 15.0 g/dL   HCT 40.4 36.0 - 46.0 %   MCV 81.1 80.0 - 100.0 fL   MCH 26.3 26.0 - 34.0 pg   MCHC 32.4 30.0 - 36.0 g/dL   RDW 16.1 (H) 11.5 - 15.5 %   Platelets 197 150 - 400 K/uL   nRBC 0.0 0.0 - 0.2 %   Neutrophils Relative % 55 %   Neutro Abs 3.0 1.7 - 7.7 K/uL   Lymphocytes Relative 30 %   Lymphs Abs 1.6 0.7 - 4.0 K/uL   Monocytes Relative 9 %   Monocytes Absolute 0.5 0.1 - 1.0 K/uL   Eosinophils Relative 5 %   Eosinophils Absolute 0.3 0.0 - 0.5 K/uL   Basophils Relative 1 %   Basophils Absolute 0.0 0.0 - 0.1 K/uL   Immature Granulocytes 0 %   Abs Immature Granulocytes 0.01 0.00 - 0.07 K/uL  Basic metabolic panel     Status: None   Collection Time: 04/13/21 11:08 PM  Result Value Ref Range   Sodium 135  135 - 145 mmol/L   Potassium 3.6 3.5 - 5.1 mmol/L   Chloride 102 98 - 111 mmol/L   CO2 23 22 - 32 mmol/L   Glucose, Bld 91 70 - 99 mg/dL   BUN 12 8 - 23 mg/dL   Creatinine, Ser 0.75 0.44 - 1.00 mg/dL   Calcium 9.4 8.9 - 10.3 mg/dL   GFR, Estimated >60 >60 mL/min   Anion gap 10 5 - 15  Troponin I (High Sensitivity)     Status: None   Collection Time: 04/13/21 11:08 PM  Result Value Ref Range   Troponin I (High Sensitivity) <2 <18 ng/L  Brain natriuretic peptide     Status: None   Collection Time: 04/13/21 11:08 PM  Result Value Ref Range   B Natriuretic Peptide 24.0 0.0 - 100.0 pg/mL    Imaging Studies: DG Chest Port 1 View  Result Date: 04/13/2021 CLINICAL DATA:  Cough and shortness of breath. EXAM: PORTABLE CHEST 1 VIEW COMPARISON:  11/15/2020 FINDINGS: Heart size and pulmonary vascularity are normal. Linear atelectasis in the left base. Lungs are otherwise clear. No pleural effusions. No pneumothorax. Mediastinal contours appear intact.  IMPRESSION: Linear atelectasis in the left base.  No focal consolidation. Electronically Signed   By: Lucienne Capers M.D.   On: 04/13/2021 22:47    12:44 AM No evidence of acute coronary syndrome, infiltrate, COVID or influenza infection at this time.  No evidence of congestive heart failure based on BMP.  Patient does have a history of COPD.  We will ensure she has an up-to-date inhaler and AeroChamber and instructed in their use.    Demario Faniel, MD 04/14/21 0028    Shanon Rosser, MD 04/14/21 [id number]

## 2021-04-14 NOTE — ED Notes (Signed)
Order for inhaler was placed x 2.  Pt. Was given the inhaler per order and one order was canceled.

## 2022-03-24 ENCOUNTER — Emergency Department (HOSPITAL_BASED_OUTPATIENT_CLINIC_OR_DEPARTMENT_OTHER)
Admission: EM | Admit: 2022-03-24 | Discharge: 2022-03-24 | Disposition: A | Discharge status: Home / Self Care | Payer: Medicare Other | Attending: Emergency Medicine | Admitting: Emergency Medicine

## 2022-03-24 ENCOUNTER — Encounter (HOSPITAL_BASED_OUTPATIENT_CLINIC_OR_DEPARTMENT_OTHER): Payer: Self-pay | Admitting: Emergency Medicine

## 2022-03-24 ENCOUNTER — Other Ambulatory Visit: Payer: Self-pay

## 2022-03-24 ENCOUNTER — Emergency Department (HOSPITAL_BASED_OUTPATIENT_CLINIC_OR_DEPARTMENT_OTHER): Payer: Medicare Other

## 2022-03-24 DIAGNOSIS — Z7982 Long term (current) use of aspirin: Secondary | ICD-10-CM | POA: Diagnosis not present

## 2022-03-24 DIAGNOSIS — R072 Precordial pain: Secondary | ICD-10-CM | POA: Diagnosis not present

## 2022-03-24 DIAGNOSIS — N898 Other specified noninflammatory disorders of vagina: Secondary | ICD-10-CM | POA: Diagnosis not present

## 2022-03-24 DIAGNOSIS — R39198 Other difficulties with micturition: Secondary | ICD-10-CM | POA: Insufficient documentation

## 2022-03-24 DIAGNOSIS — R0602 Shortness of breath: Secondary | ICD-10-CM | POA: Insufficient documentation

## 2022-03-24 DIAGNOSIS — R079 Chest pain, unspecified: Secondary | ICD-10-CM

## 2022-03-24 LAB — COMPREHENSIVE METABOLIC PANEL
ALT: 12 U/L (ref 0–44)
AST: 22 U/L (ref 15–41)
Albumin: 4 g/dL (ref 3.5–5.0)
Alkaline Phosphatase: 53 U/L (ref 38–126)
Anion gap: 11 (ref 5–15)
BUN: 19 mg/dL (ref 8–23)
CO2: 23 mmol/L (ref 22–32)
Calcium: 9.6 mg/dL (ref 8.9–10.3)
Chloride: 104 mmol/L (ref 98–111)
Creatinine, Ser: 0.77 mg/dL (ref 0.44–1.00)
GFR, Estimated: >60 mL/min (ref >60)
Glucose, Bld: 105 mg/dL — ABNORMAL HIGH (ref 70–99)
Potassium: 3.5 mmol/L (ref 3.5–5.1)
Sodium: 138 mmol/L (ref 135–145)
Total Bilirubin: 0.3 mg/dL (ref 0.3–1.2)
Total Protein: 8 g/dL (ref 6.5–8.1)

## 2022-03-24 LAB — TROPONIN I (HIGH SENSITIVITY)
Troponin I (High Sensitivity): <2 ng/L (ref <18)
Troponin I (High Sensitivity): <2 ng/L (ref <18)

## 2022-03-24 LAB — CBC
HCT: 43.6 % (ref 36.0–46.0)
Hemoglobin: 13.8 g/dL (ref 12.0–15.0)
MCH: 26.2 pg (ref 26.0–34.0)
MCHC: 31.7 g/dL (ref 30.0–36.0)
MCV: 82.9 fL (ref 80.0–100.0)
Platelets: 254 10*3/uL (ref 150–400)
RBC: 5.26 MIL/uL — ABNORMAL HIGH (ref 3.87–5.11)
RDW: 16.3 % — ABNORMAL HIGH (ref 11.5–15.5)
WBC: 5 10*3/uL (ref 4.0–10.5)
nRBC: 0 % (ref 0.0–0.2)

## 2022-03-24 LAB — URINALYSIS, ROUTINE W REFLEX MICROSCOPIC
Bilirubin Urine: NEGATIVE
Glucose, UA: >=500 mg/dL — AB
Hgb urine dipstick: NEGATIVE
Ketones, ur: NEGATIVE mg/dL
Leukocytes,Ua: NEGATIVE
Nitrite: NEGATIVE
Protein, ur: NEGATIVE mg/dL
Specific Gravity, Urine: 1.02 (ref 1.005–1.030)
pH: 7 (ref 5.0–8.0)

## 2022-03-24 LAB — URINALYSIS, MICROSCOPIC (REFLEX): RBC / HPF: NONE SEEN RBC/hpf (ref 0–5)

## 2022-03-24 MED ORDER — CEPHALEXIN 500 MG PO CAPS: 500.0000 mg | ORAL_CAPSULE | Freq: Four times a day (QID) | ORAL | 0 refills | Status: AC

## 2022-03-24 MED ORDER — FLUCONAZOLE 150 MG PO TABS: 150.0000 mg | ORAL_TABLET | Freq: Every day | ORAL | 1 refills | Status: AC

## 2022-03-24 NOTE — ED Notes (Signed)
Pt refused covid swab. °

## 2022-03-24 NOTE — Discharge Instructions (Signed)
Follow up with your Physician for recheck in 1 week

## 2022-03-24 NOTE — ED Triage Notes (Signed)
Pt speaks [place] ebo?Marland Kitchen Refuses translator. Pt arrives pov, slow gait c/o mid-sternal CP since yesterday. Pt does not compute rating pain, states it is not a 10.

## 2022-03-24 NOTE — ED Provider Notes (Signed)
[facility] Provider Note   CSN: [id number] Arrival date & time: 03/24/22  1724     History  No chief complaint on file.   Krystal Mckay is a 72 y.o. female.  Patient has multiple complaints patient complains of having pain in the middle of her chest since yesterday.  Patient reports that she has had similar episodes in the past.  Patient states that the pain is normally associated with breathing and is caused by her lung disease.  Patient also reports that she is having discomfort with urination.  Patient was recently treated with by her primary care physician with Diflucan.  Patient states this made her symptoms better and she is requesting a refill of Diflucan  The history is provided by the patient. No language interpreter was used.  Chest Pain Pain location:  Substernal area Pain quality: aching   Pain radiates to:  Does not radiate Pain severity:  Moderate Onset quality:  Gradual Duration:  2 days Progression:  Worsening Chronicity:  New Relieved by:  Nothing Worsened by:  Nothing Ineffective treatments:  None tried Associated symptoms: no cough        Home Medications Prior to Admission medications   Medication Sig Start Date End Date Taking? Authorizing Provider  cephALEXin (KEFLEX) 500 MG capsule Take 1 capsule (500 mg total) by mouth 4 (four) times daily for 10 days. 03/24/22 04/03/22 Yes Fransico Meadow, PA-C  fluconazole (DIFLUCAN) 150 MG tablet Take 1 tablet (150 mg total) by mouth daily. 03/24/22  Yes Fransico Meadow, PA-C  acetaminophen (TYLENOL) 500 MG tablet Take 2 tablets (1,000 mg total) by mouth every 8 (eight) hours as needed for moderate pain. 08/25/17   Mackuen, Courteney Lyn, MD  albuterol (PROVENTIL HFA;VENTOLIN HFA) 108 (90 Base) MCG/ACT inhaler Inhale 2 puffs into the lungs every 4 (four) hours as needed for wheezing or shortness of breath.    [provider]  amLODipine (NORVASC) 10 MG tablet Take 10 mg by  mouth daily.    [provider]  aspirin EC 81 MG tablet Take 81 mg by mouth daily.    [provider]  Fluticasone-Salmeterol (ADVAIR) 500-50 MCG/DOSE AEPB Inhale 1 puff into the lungs 2 (two) times daily.    [provider]  levocetirizine (XYZAL) 5 MG tablet TAKE 1 TABLET BY MOUTH EVERY EVERY EVENING 10/26/19 10/25/20  Thornton Dales I, MD  metFORMIN (GLUCOPHAGE) 500 MG tablet Take 1,000 mg by mouth 2 (two) times daily with a meal.    [provider]  Olopatadine HCl (PATADAY) 0.2 % SOLN Apply to eye.    [provider]      Allergies    Patient has no known allergies.    Review of Systems   Review of Systems  Respiratory:  Negative for cough.   Cardiovascular:  Positive for chest pain.  All other systems reviewed and are negative.   Physical Exam Updated Vital Signs BP 137/66 (BP Location: Right Arm)   Pulse 74   Temp 98.3 F (36.8 C) (Oral)   Resp 20   LMP  (LMP Unknown)   SpO2 98%  Physical Exam Vitals and nursing note reviewed.  Constitutional:      Appearance: She is well-developed.  HENT:     Head: Normocephalic.     Mouth/Throat:     Mouth: Mucous membranes are moist.  Cardiovascular:     Rate and Rhythm: Normal rate.  Pulmonary:     Effort: Pulmonary effort is  normal.  Abdominal:     General: Abdomen is flat. There is no distension.     Palpations: Abdomen is soft.  Musculoskeletal:        General: Normal range of motion.     Cervical back: Normal range of motion.  Skin:    General: Skin is warm.  Neurological:     Mental Status: She is alert and oriented to person, place, and time.  Psychiatric:        Mood and Affect: Mood normal.     ED Results / Procedures / Treatments   Labs (all labs ordered are listed, but only abnormal results are displayed) Labs Reviewed  CBC - Abnormal; Notable for the following components:      Result Value   RBC 5.26 (*)    RDW 16.3 (*)    All other components within  normal limits  COMPREHENSIVE METABOLIC PANEL - Abnormal; Notable for the following components:   Glucose, Bld 105 (*)    All other components within normal limits  URINALYSIS, ROUTINE W REFLEX MICROSCOPIC - Abnormal; Notable for the following components:   Glucose, UA >=500 (*)    All other components within normal limits  URINALYSIS, MICROSCOPIC (REFLEX) - Abnormal; Notable for the following components:   Bacteria, UA MANY (*)    All other components within normal limits  TROPONIN I (HIGH SENSITIVITY)  TROPONIN I (HIGH SENSITIVITY)    EKG None  Radiology DG Chest 2 View  Result Date: 03/24/2022 CLINICAL DATA:  Chest pain EXAM: CHEST - 2 VIEW COMPARISON:  08/21/2021 chest radiograph. FINDINGS: Stable cardiomediastinal silhouette with normal heart size. No pneumothorax. No pleural effusion. Lungs appear clear, with no acute consolidative airspace disease and no pulmonary edema. IMPRESSION: No active cardiopulmonary disease. Electronically Signed   By: Ilona Sorrel M.D.   On: 03/24/2022 18:33    Procedures Procedures    Medications Ordered in ED Medications - No data to display  ED Course/ Medical Decision Making/ A&P                             Medical Decision Making Patient complains of pain in her chest since yesterday.  Patient also complains of discomfort with urination and itching in the vaginal area patient is requesting Diflucan  Amount and/or Complexity of Data Reviewed External Data Reviewed: notes.    Details: Primary care notes are reviewed Labs: ordered. Decision-making details documented in ED Course.    Details: UA shows many bacteria troponin is negative Labs are ordered reviewed and interpreted Opponent is negative x 2 Radiology: ordered and independent interpretation performed. Decision-making details documented in ED Course.    Details: Chest x-ray shows no evidence of any acute disease  Risk Prescription drug management. Risk Details: The patient on  results I will treat her with Keflex for urinary tract infection patient is given a prescription for Diflucan           Final Clinical Impression(s) / ED Diagnoses Final diagnoses:  Nonspecific chest pain    Rx / DC Orders ED Discharge Orders          Ordered    cephALEXin (KEFLEX) 500 MG capsule  4 times daily        03/24/22 2132    fluconazole (DIFLUCAN) 150 MG tablet  Daily        03/24/22 2132           An After Visit Summary was  printed and given to the patient.    Elson Areas, PA-C 03/24/22 2324    Elayne Snare K, DO 03/27/22 [phone]
